# Patient Record
Sex: Female | Born: 1985 | Race: White | Hispanic: No | Marital: Married | Smoking: Former smoker
Health system: Southern US, Community
[De-identification: ages and names within clinical notes are randomized; demographics above are authoritative.]

## PROBLEM LIST (undated history)

## (undated) ENCOUNTER — Inpatient Hospital Stay (HOSPITAL_COMMUNITY): Payer: Self-pay

## (undated) DIAGNOSIS — F419 Anxiety disorder, unspecified: Secondary | ICD-10-CM

## (undated) DIAGNOSIS — G43909 Migraine, unspecified, not intractable, without status migrainosus: Secondary | ICD-10-CM

## (undated) DIAGNOSIS — F329 Major depressive disorder, single episode, unspecified: Secondary | ICD-10-CM

## (undated) DIAGNOSIS — R87629 Unspecified abnormal cytological findings in specimens from vagina: Secondary | ICD-10-CM

## (undated) DIAGNOSIS — F32A Depression, unspecified: Secondary | ICD-10-CM

## (undated) HISTORY — DX: Anxiety disorder, unspecified: F41.9

## (undated) HISTORY — DX: Unspecified abnormal cytological findings in specimens from vagina: R87.629

## (undated) HISTORY — DX: Major depressive disorder, single episode, unspecified: F32.9

## (undated) HISTORY — PX: ESOPHAGOGASTRODUODENOSCOPY ENDOSCOPY: SHX5814

## (undated) HISTORY — DX: Depression, unspecified: F32.A

## (undated) HISTORY — DX: Migraine, unspecified, not intractable, without status migrainosus: G43.909

---

## 2010-12-08 HISTORY — PX: CRYOTHERAPY: SHX1416

## 2017-04-22 DIAGNOSIS — M5382 Other specified dorsopathies, cervical region: Secondary | ICD-10-CM | POA: Insufficient documentation

## 2017-04-22 DIAGNOSIS — R51 Headache: Secondary | ICD-10-CM

## 2017-04-22 DIAGNOSIS — M1990 Unspecified osteoarthritis, unspecified site: Secondary | ICD-10-CM | POA: Insufficient documentation

## 2017-04-22 DIAGNOSIS — M530 Cervicocranial syndrome: Secondary | ICD-10-CM | POA: Insufficient documentation

## 2017-04-22 DIAGNOSIS — G8929 Other chronic pain: Secondary | ICD-10-CM | POA: Insufficient documentation

## 2017-08-25 LAB — OB RESULTS CONSOLE HGB/HCT, BLOOD
HCT: 41
HEMOGLOBIN: 13.7

## 2017-08-25 LAB — OB RESULTS CONSOLE ABO/RH: RH TYPE: NEGATIVE

## 2017-08-25 LAB — OB RESULTS CONSOLE PLATELET COUNT: Platelets: 333

## 2017-08-25 LAB — OB RESULTS CONSOLE RUBELLA ANTIBODY, IGM: Rubella: IMMUNE

## 2017-08-25 LAB — OB RESULTS CONSOLE RPR: RPR: NONREACTIVE

## 2017-08-25 LAB — OB RESULTS CONSOLE ANTIBODY SCREEN: ANTIBODY SCREEN: NEGATIVE

## 2017-08-25 LAB — OB RESULTS CONSOLE HEPATITIS B SURFACE ANTIGEN: HEP B S AG: NEGATIVE

## 2017-08-25 LAB — HIV ANTIBODY (ROUTINE TESTING W REFLEX): HIV Screen 4th Generation wRfx: NEGATIVE

## 2017-09-08 ENCOUNTER — Emergency Department (HOSPITAL_BASED_OUTPATIENT_CLINIC_OR_DEPARTMENT_OTHER)
Admission: EM | Admit: 2017-09-08 | Discharge: 2017-09-08 | Disposition: A | Discharge status: Home / Self Care | Payer: BLUE CROSS/BLUE SHIELD | Attending: Emergency Medicine | Admitting: Emergency Medicine

## 2017-09-08 ENCOUNTER — Encounter (HOSPITAL_BASED_OUTPATIENT_CLINIC_OR_DEPARTMENT_OTHER): Payer: Self-pay

## 2017-09-08 DIAGNOSIS — Z87891 Personal history of nicotine dependence: Secondary | ICD-10-CM | POA: Insufficient documentation

## 2017-09-08 DIAGNOSIS — Z3A09 9 weeks gestation of pregnancy: Secondary | ICD-10-CM | POA: Insufficient documentation

## 2017-09-08 DIAGNOSIS — O200 Threatened abortion: Secondary | ICD-10-CM | POA: Insufficient documentation

## 2017-09-08 DIAGNOSIS — O209 Hemorrhage in early pregnancy, unspecified: Secondary | ICD-10-CM | POA: Diagnosis present

## 2017-09-08 DIAGNOSIS — Z2913 Encounter for prophylactic Rho(D) immune globulin: Secondary | ICD-10-CM | POA: Insufficient documentation

## 2017-09-08 LAB — RH IG WORKUP (INCLUDES ABO/RH)
ABO/RH(D): O NEG
Antibody Screen: NEGATIVE
Gestational Age(Wks): 9

## 2017-09-08 LAB — HCG, QUANTITATIVE, PREGNANCY: HCG, BETA CHAIN, QUANT, S: 110318 m[IU]/mL — AB (ref <5)

## 2017-09-08 MED ORDER — RHO D IMMUNE GLOBULIN 1500 UNIT/2ML IJ SOSY
300.0000 ug | PREFILLED_SYRINGE | Freq: Once | INTRAMUSCULAR | Status: AC
  Administered 2017-09-08: 300 ug via INTRAMUSCULAR
  Filled 2017-09-08: qty 2

## 2017-09-08 NOTE — Discharge Instructions (Signed)
Recommend a repeat quantitative hCG in 48 hours. [facility] any new or worse symptoms.

## 2017-09-08 NOTE — ED Notes (Signed)
Fetal heart tones not found with doppler

## 2017-09-08 NOTE — ED Provider Notes (Signed)
[facility] Provider Note   CSN: [id number] Arrival date & time: 09/08/17  8101     History   Chief Complaint Chief Complaint  Patient presents with  . Vaginal Bleeding    pregnant    HPI Carol [name] is a 31 y.o. female.  Patient with some vaginal spotting that started today. Patient is approximately [redacted] weeks pregnant. Patient new to the area just moving here. But has had more than 1 ultrasound in the past. She is known to have an intrauterine pregnancy that was viable. Her estimated date of delivery is 04/15/2018. She's gravida 3 para 1 AB 1. She has a child at home. Her last pregnancy ended in miscarriage in January. Patient is known to be negative blood type. Patient does not have an OB/GYN in this area. Patient's concerned about possible miscarriage.      History reviewed. No pertinent past medical history.  There are no active problems to display for this patient.   History reviewed. No pertinent surgical history.  OB History    Gravida Para Term Preterm AB Living   1             SAB TAB Ectopic Multiple Live Births                   Home Medications    Prior to Admission medications   Not on File    Family History No family history on file.  Social History Social History  Substance Use Topics  . Smoking status: Former Research scientist (life sciences)  . Smokeless tobacco: Never Used  . Alcohol use No     Allergies   Codeine   Review of Systems Review of Systems  Constitutional: Negative for fever.  HENT: Negative for congestion.   Eyes: Negative for redness.  Respiratory: Negative for shortness of breath.   Cardiovascular: Negative for chest pain.  Gastrointestinal: Negative for abdominal pain.  Genitourinary: Positive for vaginal bleeding. Negative for dysuria and pelvic pain.  Musculoskeletal: Negative for back pain.  Skin: Negative for rash.  Neurological: Negative for headaches.  Hematological: Does not bruise/bleed easily.    Psychiatric/Behavioral: Negative for confusion.     Physical Exam Updated Vital Signs BP 98/64   Pulse 69   Temp 98.2 F (36.8 C) (Oral)   Resp 16   Ht 1.702 m (5\' 7" )   Wt 72.6 kg (160 lb)   SpO2 100%   BMI 25.06 kg/m   Physical Exam  Constitutional: She is oriented to person, place, and time. She appears well-developed and well-nourished. No distress.  HENT:  Head: Normocephalic and atraumatic.  Mouth/Throat: Oropharynx is clear and moist.  Eyes: Pupils are equal, round, and reactive to light. Conjunctivae and EOM are normal.  Neck: Normal range of motion. Neck supple.  Cardiovascular: Normal rate, regular rhythm and normal heart sounds.   Pulmonary/Chest: Effort normal and breath sounds normal.  Abdominal: Soft. Bowel sounds are normal. There is no tenderness.  Musculoskeletal: Normal range of motion.  Neurological: She is alert and oriented to person, place, and time. No cranial nerve deficit or sensory deficit. She exhibits normal muscle tone. Coordination normal.  Skin: Skin is warm.  Nursing note and vitals reviewed.    ED Treatments / Results  Labs (all labs ordered are listed, but only abnormal results are displayed) Labs Reviewed  HCG, QUANTITATIVE, PREGNANCY - Abnormal; Notable for the following:       Result Value   hCG, Beta Chain, Quant, S 110,318 (*)  All other components within normal limits  RH IG WORKUP (INCLUDES ABO/RH)    EKG  EKG Interpretation None       Radiology No results found.  Procedures Procedures (including critical care time)  Medications Ordered in ED Medications  rho (d) immune globulin (RHIG/RHOPHYLAC) injection 300 mcg (300 mcg Intramuscular Given 09/08/17 2211)     Initial Impression / Assessment and Plan / ED Course  I have reviewed the triage vital signs and the nursing notes.  Pertinent labs & imaging results that were available during my care of the patient were reviewed by me and considered in my medical  decision making (see chart for details).    Attempted fetal heart tones. But not able to find them in the sun unexpected at [redacted] weeks pregnant. Patient does not warrant another ultrasound. She is not a risk for ectopic pregnancy. Just started with spotting today. Patient states that she is negative blood type and has received program before. Abdomen soft nontender. No heavy bleeding.  Obtained a quantitative hCG for follow-up purposes. Recommend repeat in 48 hours.  Patient's blood type was O-. Received program here. [facility]. Patient will return for any new or worse symptoms.  Patient nontoxic no acute distress.  Patient understands that the spotting could result in a threatened miscarriage and that there is no technology to stop that.     Final Clinical Impressions(s) / ED Diagnoses   Final diagnoses:  Threatened miscarriage    New Prescriptions New Prescriptions   No medications on file     Fredia Sorrow, MD 09/09/17 0002

## 2017-09-08 NOTE — ED Triage Notes (Signed)
C/o blood on tissue today x 1-pt states she is 9 week preg by US-new to area-no OB-miscarriage in Jan-NAD-steady gait

## 2017-09-28 ENCOUNTER — Ambulatory Visit (INDEPENDENT_AMBULATORY_CARE_PROVIDER_SITE_OTHER): Payer: BLUE CROSS/BLUE SHIELD | Admitting: Obstetrics & Gynecology

## 2017-09-28 ENCOUNTER — Encounter: Payer: Self-pay | Admitting: Obstetrics & Gynecology

## 2017-09-28 VITALS — BP 106/85 | HR 85 | Wt 165.1 lb

## 2017-09-28 DIAGNOSIS — Z113 Encounter for screening for infections with a predominantly sexual mode of transmission: Secondary | ICD-10-CM

## 2017-09-28 DIAGNOSIS — Z6791 Unspecified blood type, Rh negative: Secondary | ICD-10-CM | POA: Insufficient documentation

## 2017-09-28 DIAGNOSIS — Z3480 Encounter for supervision of other normal pregnancy, unspecified trimester: Secondary | ICD-10-CM | POA: Insufficient documentation

## 2017-09-28 DIAGNOSIS — Z1151 Encounter for screening for human papillomavirus (HPV): Secondary | ICD-10-CM

## 2017-09-28 DIAGNOSIS — Z3481 Encounter for supervision of other normal pregnancy, first trimester: Secondary | ICD-10-CM

## 2017-09-28 DIAGNOSIS — G43909 Migraine, unspecified, not intractable, without status migrainosus: Secondary | ICD-10-CM | POA: Insufficient documentation

## 2017-09-28 DIAGNOSIS — Z3490 Encounter for supervision of normal pregnancy, unspecified, unspecified trimester: Secondary | ICD-10-CM | POA: Insufficient documentation

## 2017-09-28 DIAGNOSIS — Z124 Encounter for screening for malignant neoplasm of cervix: Secondary | ICD-10-CM

## 2017-09-28 DIAGNOSIS — R319 Hematuria, unspecified: Secondary | ICD-10-CM

## 2017-09-28 DIAGNOSIS — O09891 Supervision of other high risk pregnancies, first trimester: Secondary | ICD-10-CM

## 2017-09-28 DIAGNOSIS — G43701 Chronic migraine without aura, not intractable, with status migrainosus: Secondary | ICD-10-CM

## 2017-09-28 DIAGNOSIS — O26899 Other specified pregnancy related conditions, unspecified trimester: Principal | ICD-10-CM

## 2017-09-28 LAB — POCT URINALYSIS DIP (DEVICE)
BILIRUBIN URINE: NEGATIVE
Glucose, UA: NEGATIVE mg/dL
LEUKOCYTES UA: NEGATIVE
Nitrite: NEGATIVE
Protein, ur: NEGATIVE mg/dL
SPECIFIC GRAVITY, URINE: 1.02 (ref 1.005–1.030)
Urobilinogen, UA: 0.2 mg/dL (ref 0.0–1.0)
pH: 6 (ref 5.0–8.0)

## 2017-09-28 NOTE — Progress Notes (Signed)
Here for initial prenatal visit. Given new patient education booklets. Scant blood noted in urine.

## 2017-09-29 LAB — CYTOLOGY - PAP
Adequacy: ABSENT
Chlamydia: NEGATIVE
Diagnosis: NEGATIVE
HPV (WINDOPATH): NOT DETECTED
Neisseria Gonorrhea: NEGATIVE

## 2017-09-29 NOTE — Patient Instructions (Signed)
First Trimester of Pregnancy The first trimester of pregnancy is from week 1 until the end of week 13 (months 1 through 3). A week after a sperm fertilizes an egg, the egg will implant on the wall of the uterus. This embryo will begin to develop into a baby. Genes from you and your partner will form the baby. The female genes will determine whether the baby will be a boy or a girl. At 6-8 weeks, the eyes and face will be formed, and the heartbeat can be seen on ultrasound. At the end of 12 weeks, all the baby's organs will be formed. Now that you are pregnant, you will want to do everything you can to have a healthy baby. Two of the most important things are to get good prenatal care and to follow your health care provider's instructions. Prenatal care is all the medical care you receive before the baby's birth. This care will help prevent, find, and treat any problems during the pregnancy and childbirth. Body changes during your first trimester Your body goes through many changes during pregnancy. The changes vary from woman to woman.  You may gain or lose a couple of pounds at first.  You may feel sick to your stomach (nauseous) and you may throw up (vomit). If the vomiting is uncontrollable, call your health care provider.  You may tire easily.  You may develop headaches that can be relieved by medicines. All medicines should be approved by your health care provider.  You may urinate more often. Painful urination may mean you have a bladder infection.  You may develop heartburn as a result of your pregnancy.  You may develop constipation because certain hormones are causing the muscles that push stool through your intestines to slow down.  You may develop hemorrhoids or swollen veins (varicose veins).  Your breasts may begin to grow larger and become tender. Your nipples may stick out more, and the tissue that surrounds them (areola) may become darker.  Your gums may bleed and may be  sensitive to brushing and flossing.  Dark spots or blotches (chloasma, mask of pregnancy) may develop on your face. This will likely fade after the baby is born.  Your menstrual periods will stop.  You may have a loss of appetite.  You may develop cravings for certain kinds of food.  You may have changes in your emotions from day to day, such as being excited to be pregnant or being concerned that something may go wrong with the pregnancy and baby.  You may have more vivid and strange dreams.  You may have changes in your hair. These can include thickening of your hair, rapid growth, and changes in texture. Some women also have hair loss during or after pregnancy, or hair that feels dry or thin. Your hair will most likely return to normal after your baby is born.  What to expect at prenatal visits During a routine prenatal visit:  You will be weighed to make sure you and the baby are growing normally.  Your blood pressure will be taken.  Your abdomen will be measured to track your baby's growth.  The fetal heartbeat will be listened to between weeks 10 and 14 of your pregnancy.  Test results from any previous visits will be discussed.  Your health care provider may ask you:  How you are feeling.  If you are feeling the baby move.  If you have had any abnormal symptoms, such as leaking fluid, bleeding, severe headaches,   or abdominal cramping.  If you are using any tobacco products, including cigarettes, chewing tobacco, and electronic cigarettes.  If you have any questions.  Other tests that may be performed during your first trimester include:  Blood tests to find your blood type and to check for the presence of any previous infections. The tests will also be used to check for low iron levels (anemia) and protein on red blood cells (Rh antibodies). Depending on your risk factors, or if you previously had diabetes during pregnancy, you may have tests to check for high blood  sugar that affects pregnant women (gestational diabetes).  Urine tests to check for infections, diabetes, or protein in the urine.  An ultrasound to confirm the proper growth and development of the baby.  Fetal screens for spinal cord problems (spina bifida) and Down syndrome.  HIV (human immunodeficiency virus) testing. Routine prenatal testing includes screening for HIV, unless you choose not to have this test.  You may need other tests to make sure you and the baby are doing well.  Follow these instructions at home: Medicines  Follow your health care provider's instructions regarding medicine use. Specific medicines may be either safe or unsafe to take during pregnancy.  Take a prenatal vitamin that contains at least 600 micrograms (mcg) of folic acid.  If you develop constipation, try taking a stool softener if your health care provider approves. Eating and drinking  Eat a balanced diet that includes fresh fruits and vegetables, whole grains, good sources of protein such as meat, eggs, or tofu, and low-fat dairy. Your health care provider will help you determine the amount of weight gain that is right for you.  Avoid raw meat and uncooked cheese. These carry germs that can cause birth defects in the baby.  Eating four or five small meals rather than three large meals a day may help relieve nausea and vomiting. If you start to feel nauseous, eating a few soda crackers can be helpful. Drinking liquids between meals, instead of during meals, also seems to help ease nausea and vomiting.  Limit foods that are high in fat and processed sugars, such as fried and sweet foods.  To prevent constipation: ? Eat foods that are high in fiber, such as fresh fruits and vegetables, whole grains, and beans. ? Drink enough fluid to keep your urine clear or pale yellow. Activity  Exercise only as directed by your health care provider. Most women can continue their usual exercise routine during  pregnancy. Try to exercise for 30 minutes at least 5 days a week. Exercising will help you: ? Control your weight. ? Stay in shape. ? Be prepared for labor and delivery.  Experiencing pain or cramping in the lower abdomen or lower back is a good sign that you should stop exercising. Check with your health care provider before continuing with normal exercises.  Try to avoid standing for long periods of time. Move your legs often if you must stand in one place for a long time.  Avoid heavy lifting.  Wear low-heeled shoes and practice good posture.  You may continue to have sex unless your health care provider tells you not to. Relieving pain and discomfort  Wear a good support bra to relieve breast tenderness.  Take warm sitz baths to soothe any pain or discomfort caused by hemorrhoids. Use hemorrhoid cream if your health care provider approves.  Rest with your legs elevated if you have leg cramps or low back pain.  If you develop   varicose veins in your legs, wear support hose. Elevate your feet for 15 minutes, 3-4 times a day. Limit salt in your diet. Prenatal care  Schedule your prenatal visits by the twelfth week of pregnancy. They are usually scheduled monthly at first, then more often in the last 2 months before delivery.  Write down your questions. Take them to your prenatal visits.  Keep all your prenatal visits as told by your health care provider. This is important. Safety  Wear your seat belt at all times when driving.  Make a list of emergency phone numbers, including numbers for family, friends, the hospital, and police and fire departments. General instructions  Ask your health care provider for a referral to a local prenatal education class. Begin classes no later than the beginning of month 6 of your pregnancy.  Ask for help if you have counseling or nutritional needs during pregnancy. Your health care provider can offer advice or refer you to specialists for help  with various needs.  Do not use hot tubs, steam rooms, or saunas.  Do not douche or use tampons or scented sanitary pads.  Do not cross your legs for long periods of time.  Avoid cat litter boxes and soil used by cats. These carry germs that can cause birth defects in the baby and possibly loss of the fetus by miscarriage or stillbirth.  Avoid all smoking, herbs, alcohol, and medicines not prescribed by your health care provider. Chemicals in these products affect the formation and growth of the baby.  Do not use any products that contain nicotine or tobacco, such as cigarettes and e-cigarettes. If you need help quitting, ask your health care provider. You may receive counseling support and other resources to help you quit.  Schedule a dentist appointment. At home, brush your teeth with a soft toothbrush and be gentle when you floss. Contact a health care provider if:  You have dizziness.  You have mild pelvic cramps, pelvic pressure, or nagging pain in the abdominal area.  You have persistent nausea, vomiting, or diarrhea.  You have a bad smelling vaginal discharge.  You have pain when you urinate.  You notice increased swelling in your face, hands, legs, or ankles.  You are exposed to fifth disease or chickenpox.  You are exposed to German measles (rubella) and have never had it. Get help right away if:  You have a fever.  You are leaking fluid from your vagina.  You have spotting or bleeding from your vagina.  You have severe abdominal cramping or pain.  You have rapid weight gain or loss.  You vomit blood or material that looks like coffee grounds.  You develop a severe headache.  You have shortness of breath.  You have any kind of trauma, such as from a fall or a car accident. Summary  The first trimester of pregnancy is from week 1 until the end of week 13 (months 1 through 3).  Your body goes through many changes during pregnancy. The changes vary from  woman to woman.  You will have routine prenatal visits. During those visits, your health care provider will examine you, discuss any test results you may have, and talk with you about how you are feeling. This information is not intended to replace advice given to you by your health care provider. Make sure you discuss any questions you have with your health care provider. Document Released: 11/18/2001 Document Revised: 11/05/2016 Document Reviewed: 11/05/2016 Elsevier Interactive Patient Education  2017 Elsevier   Inc.  

## 2017-09-29 NOTE — Progress Notes (Signed)
Subjective:   Carol [name] is a 31 y.o. G3P1011 at [redacted]w[redacted]d by LMP, early ultrasound being seen today for her first obstetrical visit.  Her obstetrical history is significant for recent SAB and term SVD.  Received initial visit and labs at her previous OB's office but just moved here. Results are under Media tab. Patient does intend to breast feed. Pregnancy history fully reviewed.  Patient reports no complaints.  HISTORY: Obstetric History   G3   P1   T1   P0   A1   L1    SAB1   TAB0   Ectopic0   Multiple0   Live Births1     # Outcome Date GA Lbr Len/2nd Weight Sex Delivery Anes PTL Lv  3 Current           2 SAB 12/2016 [redacted]w[redacted]d         1 Term 05/27/14 [redacted]w[redacted]d  8 lb 10 oz (3.912 kg) F Vag-Forceps EPI  LIV     Past Medical History:  Diagnosis Date  . Anxiety   . Depression   . Migraines   . Vaginal Pap smear, abnormal    Past Surgical History:  Procedure Laterality Date  . CRYOTHERAPY  2012   Family History  Problem Relation Age of Onset  . Lung disease Mother   . COPD Mother   . Gout Father    Social History  Substance Use Topics  . Smoking status: Former Research scientist (life sciences)  . Smokeless tobacco: Never Used  . Alcohol use No   Allergies  Allergen Reactions  . Codeine Nausea And Vomiting   No current outpatient prescriptions on file prior to visit.   No current facility-administered medications on file prior to visit.      Exam   Vitals:   09/28/17 0823  BP: 106/85  Pulse: 85  Weight: 165 lb 1.6 oz (74.9 kg)   Fetal Heart Rate (bpm): 160  Uterus:     Pelvic Exam: Perineum: no hemorrhoids, normal perineum   Vulva: normal external genitalia, no lesions   Vagina:  normal mucosa, normal discharge   Cervix: no lesions and normal, pap smear done.    Adnexa: normal adnexa and no mass, fullness, tenderness   Bony Pelvis: average  System: General: well-developed, well-nourished female in no acute distress   Breast:  normal appearance, no masses or tenderness   Skin: normal coloration and turgor, no rashes   Neurologic: oriented, normal, negative, normal mood   Extremities: normal strength, tone, and muscle mass, ROM of all joints is normal   HEENT PERRLA, extraocular movement intact and sclera clear, anicteric   Mouth/Teeth mucous membranes moist, pharynx normal without lesions and dental hygiene good   Neck supple and no masses   Cardiovascular: regular rate and rhythm   Respiratory:  no respiratory distress, normal breath sounds   Abdomen: soft, non-tender; bowel sounds normal; no masses,  no organomegaly     Assessment:   Pregnancy: [id number] Patient Active Problem List   Diagnosis Date Noted  . Rh negative state in antepartum period 09/28/2017  . Supervision of normal pregnancy 09/28/2017  . Migraines   . Chronic headache disorder 04/22/2017  . 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 Musculoskeletal disorder of neck 04/22/2017  . Osteoarthritis 04/22/2017     Plan:  1. Chronic migraine without aura with status migrainosus, not intractable Stable for now  2. Hematuria, unspecified type - Culture, OB Urine  3. Rh negative state in antepartum period Will need Rhogam  at 28 weeks  4. Encounter for supervision of other normal pregnancy in first trimester - Babyscripts Schedule Optimization - Cytology - PAP - [place] MFM Fetal Nuchal Translucency; Future  Initial labs drawn. Continue prenatal vitamins. Genetic Screening discussed, First trimester screen, Quad screen and NIPS: Frist screen ordered, considering NIPS. Ultrasound discussed; fetal anatomic survey: to be ordered later Problem list reviewed and updated. The nature of [facility] with multiple MDs and other Advanced Practice Providers was explained to patient; also emphasized that residents, students are part of our team. Routine obstetric precautions reviewed. Return in about 2 weeks (around 10/12/2017) for OB 13 week visit  (Babyscripts).   Verita Schneiders, MD, [place] Attending [place], [facility] for [employer], [place]

## 2017-09-30 ENCOUNTER — Encounter: Payer: Self-pay | Admitting: Obstetrics & Gynecology

## 2017-09-30 LAB — URINE CULTURE, OB REFLEX

## 2017-09-30 LAB — CULTURE, OB URINE

## 2017-10-05 ENCOUNTER — Encounter (HOSPITAL_COMMUNITY): Payer: Self-pay | Admitting: Obstetrics & Gynecology

## 2017-10-08 ENCOUNTER — Encounter: Payer: BLUE CROSS/BLUE SHIELD | Admitting: Obstetrics & Gynecology

## 2017-10-08 ENCOUNTER — Ambulatory Visit (INDEPENDENT_AMBULATORY_CARE_PROVIDER_SITE_OTHER): Payer: Self-pay | Admitting: Obstetrics & Gynecology

## 2017-10-08 ENCOUNTER — Encounter: Payer: Self-pay | Admitting: *Deleted

## 2017-10-08 VITALS — BP 105/70 | HR 108 | Wt 171.0 lb

## 2017-10-08 DIAGNOSIS — Z3687 Encounter for antenatal screening for uncertain dates: Secondary | ICD-10-CM

## 2017-10-08 DIAGNOSIS — Z3481 Encounter for supervision of other normal pregnancy, first trimester: Secondary | ICD-10-CM

## 2017-10-08 DIAGNOSIS — Z3689 Encounter for other specified antenatal screening: Secondary | ICD-10-CM

## 2017-10-08 NOTE — Progress Notes (Signed)
DATING AND VIABILITY SONOGRAM   Carol [name] is a 31 y.o. year old G44P1011 with LMP Patient's last menstrual period was 07/12/2017. which would correlate to  [redacted]w[redacted]d weeks gestation.  She has regular menstrual cycles.   She is here today for a confirmatory initial sonogram.    GESTATION: SINGLETON     FETAL ACTIVITY:          Heart rate: 156bpm          The fetus is active.  GESTATIONAL AGE AND  BIOMETRICS:  Gestational criteria: Estimated Date of Delivery: 04/18/18 by LMP now at [redacted]w[redacted]d   CROWN RUMP LENGTH   7.47cm 13.3wks  HC 8.58cm 13.5wks                                                   AVERAGE EGA(BY THIS SCAN):  13.4 weeks  WORKING EDD( LMP ):  04/18/18     TECHNICIAN COMMENTS:  SLIUP measuring 13.4wks AUA by CRL and HC. FHR 156bpm. EDD stays at LMP per Dr. Harolyn Rutherford.    A copy of this report including all images has been saved and backed up to a second source for retrieval if needed. All measures and details of the anatomical scan, placentation, fluid volume and pelvic anatomy are contained in that report.  Mandy Hutchinson 10/08/2017 8:44 AM

## 2017-10-08 NOTE — Progress Notes (Signed)
   PRENATAL VISIT NOTE  Subjective:  Carol [name] is a 31 y.o. G3P1011 at [redacted]w[redacted]d being seen today for ongoing prenatal care.  She is currently monitored for the following issues for this low-risk pregnancy and has Migraines; Chronic headache disorder; Cervicocranial syndrome; Musculoskeletal disorder of neck; Osteoarthritis; Rh negative state in antepartum period; and Supervision of normal pregnancy on her problem list.  Patient reports no complaints. Denies leaking of fluid.   The following portions of the patient's history were reviewed and updated as appropriate: allergies, current medications, past family history, past medical history, past social history, past surgical history and problem list. Problem list updated.  Objective:   Vitals:   10/08/17 0856  BP: 105/70  Pulse: (!) 108  Weight: 171 lb (77.6 kg)       Fetal Status: Fetal Heart Rate (bpm): 156         General:  Alert, oriented and cooperative. Patient is in no acute distress.  Skin: Skin is warm and dry. No rash noted.   Cardiovascular: Normal heart rate noted  Respiratory: Normal respiratory effort, no problems with respiration noted  Abdomen: Soft, gravid, appropriate for gestational age.        Pelvic: Cervical exam deferred        Extremities: Normal range of motion.     Mental Status:  Normal mood and affect. Normal behavior. Normal judgment and thought content.   Assessment and Plan:  Pregnancy: G3P1011 at [redacted]w[redacted]d  1. Encounter for fetal anatomic survey Anatomy scan ordered - US MFM OB COMP + 14 WK; Future  2. Encounter for supervision of other normal pregnancy in first trimester Patient desires NIPS, Panorama ordered.  Anatomy scan also ordered; patient cancelled previously scheduled NT scan. - Genetic Screening - US MFM OB COMP + 14 WK; Future No other complaints or concerns.  Routine obstetric precautions reviewed. Please refer to After Visit Summary for other counseling recommendations.    Return in about 8 weeks (around 12/03/2017) for OB 20 week visit (Babyscripts).   Verita Schneiders, MD

## 2017-10-08 NOTE — Patient Instructions (Signed)

## 2017-10-12 NOTE — Progress Notes (Signed)
This encounter was created in error - please disregard.

## 2017-10-13 ENCOUNTER — Other Ambulatory Visit (HOSPITAL_COMMUNITY): Payer: BLUE CROSS/BLUE SHIELD

## 2017-10-20 ENCOUNTER — Encounter: Payer: Self-pay | Admitting: *Deleted

## 2017-10-20 ENCOUNTER — Encounter: Payer: Self-pay | Admitting: Obstetrics & Gynecology

## 2017-11-23 ENCOUNTER — Other Ambulatory Visit: Payer: Self-pay | Admitting: Obstetrics & Gynecology

## 2017-11-23 ENCOUNTER — Ambulatory Visit (HOSPITAL_COMMUNITY)
Admission: RE | Admit: 2017-11-23 | Discharge: 2017-11-23 | Disposition: A | Discharge status: Home / Self Care | Payer: Managed Care, Other (non HMO) | Source: Ambulatory Visit | Attending: Obstetrics & Gynecology | Admitting: Obstetrics & Gynecology

## 2017-11-23 DIAGNOSIS — Z3A19 19 weeks gestation of pregnancy: Secondary | ICD-10-CM | POA: Diagnosis not present

## 2017-11-23 DIAGNOSIS — Z3689 Encounter for other specified antenatal screening: Secondary | ICD-10-CM | POA: Diagnosis not present

## 2017-11-23 DIAGNOSIS — O321XX0 Maternal care for breech presentation, not applicable or unspecified: Secondary | ICD-10-CM | POA: Insufficient documentation

## 2017-11-23 DIAGNOSIS — Z3481 Encounter for supervision of other normal pregnancy, first trimester: Secondary | ICD-10-CM

## 2017-11-23 DIAGNOSIS — O358XX0 Maternal care for other (suspected) fetal abnormality and damage, not applicable or unspecified: Secondary | ICD-10-CM | POA: Diagnosis not present

## 2017-12-03 ENCOUNTER — Ambulatory Visit (INDEPENDENT_AMBULATORY_CARE_PROVIDER_SITE_OTHER): Payer: Managed Care, Other (non HMO) | Admitting: Obstetrics & Gynecology

## 2017-12-03 ENCOUNTER — Encounter: Payer: Self-pay | Admitting: Obstetrics & Gynecology

## 2017-12-03 VITALS — BP 118/72 | HR 100 | Wt 180.0 lb

## 2017-12-03 DIAGNOSIS — Z3402 Encounter for supervision of normal first pregnancy, second trimester: Secondary | ICD-10-CM

## 2017-12-03 DIAGNOSIS — Z0489 Encounter for examination and observation for other specified reasons: Secondary | ICD-10-CM

## 2017-12-03 DIAGNOSIS — IMO0002 Reserved for concepts with insufficient information to code with codable children: Secondary | ICD-10-CM

## 2017-12-03 NOTE — Progress Notes (Signed)
PRENATAL VISIT NOTE  Subjective:  Carol [name] is a 31 y.o. G3P1011 at [redacted]w[redacted]d being seen today for ongoing prenatal care.  She is currently monitored for the following issues for this low-risk pregnancy and has Migraines; Chronic headache disorder; Cervicocranial syndrome; Musculoskeletal disorder of neck; Osteoarthritis; Rh negative state in antepartum period; and Supervision of normal pregnancy on their problem list.  Patient reports no complaints.   .  .  Movement: Present. Denies leaking of fluid.   The following portions of the patient's history were reviewed and updated as appropriate: allergies, current medications, past family history, past medical history, past social history, past surgical history and problem list. Problem list updated.  Objective:   Vitals:   12/03/17 1109  BP: 118/72  Pulse: 100  Weight: 180 lb (81.6 kg)    Fetal Status: Fetal Heart Rate (bpm): 141 Fundal Height: 20 cm Movement: Present     General:  Alert, oriented and cooperative. Patient is in no acute distress.  Skin: Skin is warm and dry. No rash noted.   Cardiovascular: Normal heart rate noted  Respiratory: Normal respiratory effort, no problems with respiration noted  Abdomen: Soft, gravid, appropriate for gestational age.  Pain/Pressure: Absent     Pelvic: Cervical exam deferred        Extremities: Normal range of motion.     Mental Status:  Normal mood and affect. Normal behavior. Normal judgment and thought content.   [place] Mfm Ob Comp + 14 Wk  Result Date: 11/23/2017 ----------------------------------------------------------------------  OBSTETRICS REPORT                      (Signed Final 11/23/2017 08:59 am) ---------------------------------------------------------------------- Patient Info  ID #:       [id number]                          D.O.B.:  Jul 02, 1986 (31 yrs)  Name:       Carol [name]             Visit Date: 11/23/2017 08:11 am  ---------------------------------------------------------------------- Performed By  Performed By:     Valda Favia          Ref. Address:     [place]  Attending:        Griffin Dakin MD         Location:         [facility]  Referred By:      [facility] for                    [facility]                    Healthcare ---------------------------------------------------------------------- Orders   #  Description  Code   1  [place] MFM OB COMP + 25 WK                      [id number]  ----------------------------------------------------------------------   #  Ordered By               Order #        Accession #    Episode #   1  Verita Schneiders           [id number]      [id number]     [id number]  ---------------------------------------------------------------------- Indications   [redacted] weeks gestation of pregnancy                Z3A.19  ---------------------------------------------------------------------- OB History  Blood Type:            Height:  5'7"   Weight (lb):  171       BMI:  26.78  Gravidity:    3         Term:   1        Prem:   0        SAB:   1  TOP:          0       Ectopic:  0        Living: 1 ---------------------------------------------------------------------- Fetal Evaluation  Num Of Fetuses:     1  Fetal Heart         138  Rate(bpm):  Cardiac Activity:   Observed  Presentation:       Breech  Placenta:           Posterior, above cervical os  P. Cord Insertion:  Visualized, central  Amniotic Fluid  AFI FV:      Subjectively within normal limits                              Largest Pocket(cm)                              5.5 ---------------------------------------------------------------------- Biometry  BPD:      46.7  mm     G. Age:  20w 1d         86  %    CI:        75.68   %    70 - 86                                                          FL/HC:       16.2   %    16.1 - 18.3  HC:      170.2  mm     G. Age:  19w 4d         66  %    HC/AC:      1.15        1.09 - 1.39  AC:      148.3  mm     G. Age:  20w 1d         76  %    FL/BPD:     58.9   %  FL:       27.5  mm     G. Age:  18w 3d         20  %    FL/AC:      18.5   %    20 - 24  HUM:      28.5  mm     G. Age:  19w 1d         52  %  Est. FW:     289  gm    0 lb 10 oz      50  % ---------------------------------------------------------------------- Gestational Age  LMP:           19w 1d        Date:  07/12/17                 EDD:   04/18/18  U/S Today:     19w 4d                                        EDD:   04/15/18  Best:          19w 1d     Det. By:  LMP  (07/12/17)          EDD:   04/18/18 ---------------------------------------------------------------------- Anatomy  Cranium:               Appears normal         Aortic Arch:            Appears normal  Cavum:                 Appears normal         Ductal Arch:            Not well visualized  Ventricles:            Appears normal         Diaphragm:              Appears normal  Choroid Plexus:        Appears normal         Stomach:                Appears normal, left                                                                        sided  Cerebellum:            Appears normal         Abdomen:                Appears normal  Posterior Fossa:       Appears normal         Abdominal Wall:         Appears nml (cord  insert, abd wall)  Nuchal Fold:           Appears normal         Cord Vessels:           Appears normal (3                                                                        vessel cord)  Face:                  Appears normal         Kidneys:                Bilat pyelectasis,                         (orbits and profile)                                                                        Rt 49mm, Lt 56mm  Lips:                  Appears normal         Bladder:                 Appears normal  Thoracic:              Appears normal         Spine:                  Not well visualized  Heart:                 Not well visualized    Upper Extremities:      Appears normal  RVOT:                  Not well visualized    Lower Extremities:      Appears normal  LVOT:                  Appears normal  Other:  Fetus appears to be a female. Nasal bone visualized. Technically          difficult due to fetal position. ---------------------------------------------------------------------- Cervix Uterus Adnexa  Cervix  Length:            4.6  cm.  Normal appearance by transabdominal scan.  Uterus  No abnormality visualized.  Left Ovary  No adnexal mass visualized.  Right Ovary  No adnexal mass visualized.  Cul De Sac:   No free fluid seen.  Adnexa:       No abnormality visualized. ---------------------------------------------------------------------- Impression  Singleton intrauterine pregnancy at 19+1 weeks, here for  anatomic survey  Review of the anatomy shows no sonographic markers for  aneuploidy or structural anomalies. there is borderline  pyelectasis of 49mm in each renal pelvis (UTD A1)  The cardiac views should be considered suboptimal  secondary to fetal position  Amniotic  fluid volume is normal  Estimated fetal weight is 289g which is growth in the 50th  percentile ---------------------------------------------------------------------- Recommendations  Recommend follow-up ultrasound examination in 4 weeks to  "clear" cardiac anantomy and reassess fetal renal status ----------------------------------------------------------------------                 Griffin Dakin, MD Electronically Signed Final Report   11/23/2017 08:59 am ----------------------------------------------------------------------   Assessment and Plan:  Pregnancy: G3P1011 at [redacted]w[redacted]d  1. Evaluate anatomy not seen on prior sonogram Follow up scan ordered - US MFM OB FOLLOW UP; Future  2. Encounter for supervision of normal first  pregnancy in second trimester Preterm labor symptoms and general obstetric precautions including but not limited to vaginal bleeding, contractions, leaking of fluid and fetal movement were reviewed in detail with the patient. Please refer to After Visit Summary for other counseling recommendations.  Return in about 8 weeks (around 01/28/2018) for 2 hr GTT, 3rd trimester labs, TDap, Rhogam, OB Visit.   Verita Schneiders, MD

## 2017-12-03 NOTE — Patient Instructions (Signed)
Return to clinic for any scheduled appointments or obstetric concerns, or go to MAU for evaluation  

## 2017-12-04 ENCOUNTER — Other Ambulatory Visit: Payer: Self-pay | Admitting: Obstetrics & Gynecology

## 2017-12-04 ENCOUNTER — Encounter: Payer: Self-pay | Admitting: Obstetrics & Gynecology

## 2017-12-04 DIAGNOSIS — Z3481 Encounter for supervision of other normal pregnancy, first trimester: Secondary | ICD-10-CM

## 2017-12-04 MED ORDER — PROMETHAZINE HCL 25 MG PO TABS: ORAL_TABLET | ORAL | 5 refills | Status: DC

## 2017-12-08 ENCOUNTER — Encounter: Payer: Self-pay | Admitting: Obstetrics & Gynecology

## 2017-12-08 NOTE — L&D Delivery Note (Signed)
Patient is 32 y.o. [id number] [redacted]w[redacted]d admitted for IOL for term/LGA. S/p IOL with foley bulb, followed by Pitocin. AROM at 1404.  Prenatal course also complicated by LGA.  Delivery Note At 8:40 PM a viable female was delivered via  (Presentation: LOA).  APGAR: 8, 9; weight pending 1hr skin to skin.   Placenta status: Intact.  Cord: 3V with the following complications: None.  Cord pH: N/A  Anesthesia: Epidural   Episiotomy: None Lacerations: 1st degree;Perineal Suture Repair: vicryl 4.0 Est. Blood Loss (mL): 300  Mom to postpartum.  Baby to Couplet care / Skin to Skin.  Luiz Blare, DO 04/14/2018, 9:09 PM OB Fellow [facility], [facility]

## 2017-12-09 ENCOUNTER — Inpatient Hospital Stay (HOSPITAL_COMMUNITY): Payer: 59

## 2017-12-09 ENCOUNTER — Inpatient Hospital Stay (HOSPITAL_COMMUNITY)
Admission: AD | Admit: 2017-12-09 | Discharge: 2017-12-09 | Disposition: A | Discharge status: Home / Self Care | Payer: 59 | Source: Ambulatory Visit | Attending: Obstetrics & Gynecology | Admitting: Obstetrics & Gynecology

## 2017-12-09 ENCOUNTER — Other Ambulatory Visit: Payer: Self-pay

## 2017-12-09 ENCOUNTER — Encounter: Payer: Self-pay | Admitting: Obstetrics & Gynecology

## 2017-12-09 ENCOUNTER — Encounter (HOSPITAL_COMMUNITY): Payer: Self-pay

## 2017-12-09 DIAGNOSIS — Z3A21 21 weeks gestation of pregnancy: Secondary | ICD-10-CM | POA: Diagnosis not present

## 2017-12-09 DIAGNOSIS — O4692 Antepartum hemorrhage, unspecified, second trimester: Secondary | ICD-10-CM | POA: Insufficient documentation

## 2017-12-09 DIAGNOSIS — F419 Anxiety disorder, unspecified: Secondary | ICD-10-CM | POA: Diagnosis not present

## 2017-12-09 DIAGNOSIS — O26892 Other specified pregnancy related conditions, second trimester: Secondary | ICD-10-CM | POA: Insufficient documentation

## 2017-12-09 DIAGNOSIS — O360920 Maternal care for other rhesus isoimmunization, second trimester, not applicable or unspecified: Secondary | ICD-10-CM | POA: Insufficient documentation

## 2017-12-09 DIAGNOSIS — O99352 Diseases of the nervous system complicating pregnancy, second trimester: Secondary | ICD-10-CM | POA: Insufficient documentation

## 2017-12-09 DIAGNOSIS — O09892 Supervision of other high risk pregnancies, second trimester: Secondary | ICD-10-CM | POA: Insufficient documentation

## 2017-12-09 DIAGNOSIS — G43909 Migraine, unspecified, not intractable, without status migrainosus: Secondary | ICD-10-CM | POA: Diagnosis not present

## 2017-12-09 DIAGNOSIS — F329 Major depressive disorder, single episode, unspecified: Secondary | ICD-10-CM | POA: Diagnosis not present

## 2017-12-09 DIAGNOSIS — Z6791 Unspecified blood type, Rh negative: Secondary | ICD-10-CM

## 2017-12-09 DIAGNOSIS — Z3402 Encounter for supervision of normal first pregnancy, second trimester: Secondary | ICD-10-CM

## 2017-12-09 DIAGNOSIS — O4702 False labor before 37 completed weeks of gestation, second trimester: Secondary | ICD-10-CM | POA: Insufficient documentation

## 2017-12-09 DIAGNOSIS — O99342 Other mental disorders complicating pregnancy, second trimester: Secondary | ICD-10-CM | POA: Insufficient documentation

## 2017-12-09 DIAGNOSIS — R109 Unspecified abdominal pain: Secondary | ICD-10-CM | POA: Insufficient documentation

## 2017-12-09 DIAGNOSIS — O26899 Other specified pregnancy related conditions, unspecified trimester: Secondary | ICD-10-CM

## 2017-12-09 LAB — URINALYSIS, ROUTINE W REFLEX MICROSCOPIC
BILIRUBIN URINE: NEGATIVE
Glucose, UA: NEGATIVE mg/dL
KETONES UR: NEGATIVE mg/dL
LEUKOCYTES UA: NEGATIVE
Nitrite: NEGATIVE
PROTEIN: 30 mg/dL — AB
Specific Gravity, Urine: 1.009 (ref 1.005–1.030)
pH: 7 (ref 5.0–8.0)

## 2017-12-09 LAB — WET PREP, GENITAL
Clue Cells Wet Prep HPF POC: NONE SEEN
Sperm: NONE SEEN
Trich, Wet Prep: NONE SEEN
Yeast Wet Prep HPF POC: NONE SEEN

## 2017-12-09 MED ORDER — ACETAMINOPHEN 500 MG PO TABS
1000.0000 mg | ORAL_TABLET | Freq: Once | ORAL | Status: AC
  Administered 2017-12-09: 1000 mg via ORAL
  Filled 2017-12-09: qty 2

## 2017-12-09 NOTE — Discharge Instructions (Signed)

## 2017-12-09 NOTE — MAU Note (Signed)
Started cramping last night, bleeding noted this morning when used restroom, is dark.  Denies recent intercourse, no hx of previa or low lying placenta.

## 2017-12-09 NOTE — MAU Provider Note (Signed)
History     CSN: [id number]  Arrival date and time: 12/09/17 0845   First Provider Initiated Contact with Patient 12/09/17 [phone]    Chief Complaint  Patient presents with  . Vaginal Bleeding  . Abdominal Pain   HPI Carol [name] is a 32 y.o. G3P1011 at [redacted]w[redacted]d who presents with abdominal cramping and vaginal bleeding. She states she started cramping last night and it continued this am. She rates the cramps a 5/10 and has not taken anything for the pain. She states this morning she saw pink when she wiped. She reports feeling flutters last night but is not feeling consistent fetal movement yet. She reports no problems in the pregnancy so far and gets prenatal care at [facility].   OB History    Gravida Para Term Preterm AB Living   3 1 1   1 1    SAB TAB Ectopic Multiple Live Births   1       1      Past Medical History:  Diagnosis Date  . Anxiety   . Depression   . Migraines   . Vaginal Pap smear, abnormal     Past Surgical History:  Procedure Laterality Date  . CRYOTHERAPY  2012    Family History  Problem Relation Age of Onset  . Lung disease Mother   . COPD Mother   . Gout Father     Social History   Tobacco Use  . Smoking status: Former Research scientist (life sciences)  . Smokeless tobacco: Never Used  Substance Use Topics  . Alcohol use: No  . Drug use: No    Allergies:  Allergies  Allergen Reactions  . Codeine Nausea And Vomiting    Medications Prior to Admission  Medication Sig Dispense Refill Last Dose  . acetaminophen (TYLENOL) 500 MG tablet Take 500 mg by mouth every 6 (six) hours as needed.   Not Taking  . Coenzyme Q10 (COQ-10) 10 MG CAPS CoQ-10   Taking  . cyclobenzaprine (FLEXERIL) 5 MG tablet Take 5 mg by mouth 3 (three) times daily as needed for muscle spasms. Take 1/2 to 1 tab   Not Taking  . Prenatal Vit-Fe Fumarate-FA (PRENATAL 1+1 PO) Prenatal   Taking  . promethazine (PHENERGAN) 25 MG tablet promethazine 25 mg tablet  12.5 - 25mg  po q 6 hrs prn HA or  nausea 30 tablet 5   . zolpidem (AMBIEN) 5 MG tablet zolpidem 5 mg tablet   Not Taking    Review of Systems  Constitutional: Negative.  Negative for fatigue and fever.  HENT: Negative.   Respiratory: Negative.  Negative for shortness of breath.   Cardiovascular: Negative.  Negative for chest pain.  Gastrointestinal: Positive for abdominal pain. Negative for constipation, diarrhea, nausea and vomiting.  Genitourinary: Positive for vaginal bleeding. Negative for dysuria.  Neurological: Negative.  Negative for dizziness and headaches.   Physical Exam   Blood pressure 112/71, pulse 90, temperature 98.5 F (36.9 C), temperature source Oral, resp. rate 18, weight 180 lb 8 oz (81.9 kg), last menstrual period 07/12/2017, SpO2 100 %.  Physical Exam  Nursing note and vitals reviewed. Constitutional: She is oriented to person, place, and time. She appears well-developed and well-nourished. No distress.  HENT:  Head: Normocephalic.  Eyes: Pupils are equal, round, and reactive to light.  Cardiovascular: Normal rate, regular rhythm and normal heart sounds.  Respiratory: Effort normal and breath sounds normal. No respiratory distress.  GI: Soft. Bowel sounds are normal. She exhibits no distension. There is  no tenderness.  Genitourinary: Vaginal discharge found.  Neurological: She is alert and oriented to person, place, and time.  Skin: Skin is warm and dry.  Psychiatric: She has a normal mood and affect. Her behavior is normal. Judgment and thought content normal.   Pelvic exam: Cervix pink, visually open, without lesion, white creamy discharge, vaginal walls and external genitalia normal Bimanual exam: neg CMT, uterus nontender, adnexa without tenderness, enlargement, or mass  Dilation: Fingertip Effacement (%): 50 Exam by:: Haynes Bast CNM  FHT: 150 bpm   MAU Course  Procedures Results for orders placed or performed during the hospital encounter of 12/09/17 (from the past 24 hour(s))   Urinalysis, Routine w reflex microscopic     Status: Abnormal   Collection Time: 12/09/17  8:50 AM  Result Value Ref Range   Color, Urine YELLOW YELLOW   APPearance HAZY (A) CLEAR   Specific Gravity, Urine 1.009 1.005 - 1.030   pH 7.0 5.0 - 8.0   Glucose, UA NEGATIVE NEGATIVE mg/dL   Hgb urine dipstick LARGE (A) NEGATIVE   Bilirubin Urine NEGATIVE NEGATIVE   Ketones, ur NEGATIVE NEGATIVE mg/dL   Protein, ur 30 (A) NEGATIVE mg/dL   Nitrite NEGATIVE NEGATIVE   Leukocytes, UA NEGATIVE NEGATIVE   RBC / HPF TOO NUMEROUS TO COUNT 0 - 5 RBC/hpf   WBC, UA 0-5 0 - 5 WBC/hpf   Bacteria, UA RARE (A) NONE SEEN   Squamous Epithelial / LPF 0-5 (A) NONE SEEN   WBC Clumps PRESENT    Mucus PRESENT    MDM UA Wet prep and gc/chlamydia [place] MFM OB Limited US MFM OB Transvaginal- cervical length 3.5cm and no evidence of abruption O neg- received Rhogam 09/08/2017 Assessment and Plan   1. Threatened premature labor in second trimester   2. Rh negative state in antepartum period   3. Encounter for supervision of normal first pregnancy in second trimester   4. Abdominal pain affecting pregnancy   5. [redacted] weeks gestation of pregnancy    -Discharge home in stable condition -Vaginal bleeding and pain precautions discussed -Patient advised to follow-up with [facility] as scheduled for prenatal care -Patient may return to MAU as needed or if her condition were to change or worsen  Wende Mott CNM 12/09/2017, 10:17 AM

## 2017-12-10 LAB — GC/CHLAMYDIA PROBE AMP (~~LOC~~) NOT AT ARMC
CHLAMYDIA, DNA PROBE: NEGATIVE
NEISSERIA GONORRHEA: NEGATIVE

## 2017-12-11 ENCOUNTER — Encounter: Payer: Self-pay | Admitting: Obstetrics & Gynecology

## 2017-12-11 LAB — CULTURE, OB URINE: Culture: <10000 — AB

## 2017-12-14 ENCOUNTER — Telehealth: Payer: Self-pay

## 2017-12-14 NOTE — Telephone Encounter (Signed)
Message has been sent to my chart concerning her ultrasound.

## 2017-12-22 ENCOUNTER — Telehealth: Payer: 59 | Admitting: Family

## 2017-12-22 DIAGNOSIS — B9689 Other specified bacterial agents as the cause of diseases classified elsewhere: Secondary | ICD-10-CM

## 2017-12-22 DIAGNOSIS — J019 Acute sinusitis, unspecified: Secondary | ICD-10-CM

## 2017-12-22 MED ORDER — AMOXICILLIN-POT CLAVULANATE 875-125 MG PO TABS: 1.0000 | ORAL_TABLET | Freq: Two times a day (BID) | ORAL | 0 refills | Status: DC

## 2017-12-22 NOTE — Progress Notes (Signed)

## 2017-12-23 ENCOUNTER — Encounter: Payer: Self-pay | Admitting: Obstetrics & Gynecology

## 2017-12-25 ENCOUNTER — Encounter: Payer: Self-pay | Admitting: Obstetrics & Gynecology

## 2017-12-25 ENCOUNTER — Ambulatory Visit (HOSPITAL_COMMUNITY)
Admission: RE | Admit: 2017-12-25 | Discharge: 2017-12-25 | Disposition: A | Discharge status: Home / Self Care | Payer: 59 | Source: Ambulatory Visit | Attending: Obstetrics & Gynecology | Admitting: Obstetrics & Gynecology

## 2017-12-25 DIAGNOSIS — Z3A23 23 weeks gestation of pregnancy: Secondary | ICD-10-CM | POA: Diagnosis not present

## 2017-12-25 DIAGNOSIS — IMO0002 Reserved for concepts with insufficient information to code with codable children: Secondary | ICD-10-CM

## 2017-12-25 DIAGNOSIS — O35EXX0 Maternal care for other (suspected) fetal abnormality and damage, fetal genitourinary anomalies, not applicable or unspecified: Secondary | ICD-10-CM | POA: Insufficient documentation

## 2017-12-25 DIAGNOSIS — Z0489 Encounter for examination and observation for other specified reasons: Secondary | ICD-10-CM | POA: Diagnosis not present

## 2017-12-25 DIAGNOSIS — O358XX0 Maternal care for other (suspected) fetal abnormality and damage, not applicable or unspecified: Secondary | ICD-10-CM | POA: Insufficient documentation

## 2018-01-03 IMAGING — US US MFM OB COMP +14 WKS
1 series · 14 of 28 positions shown · non-contrast
Comparison: none

[Series 1: us mfm ob comp +14 wks · 62 acquisitions, 14 frames shown]
[im 3/62]
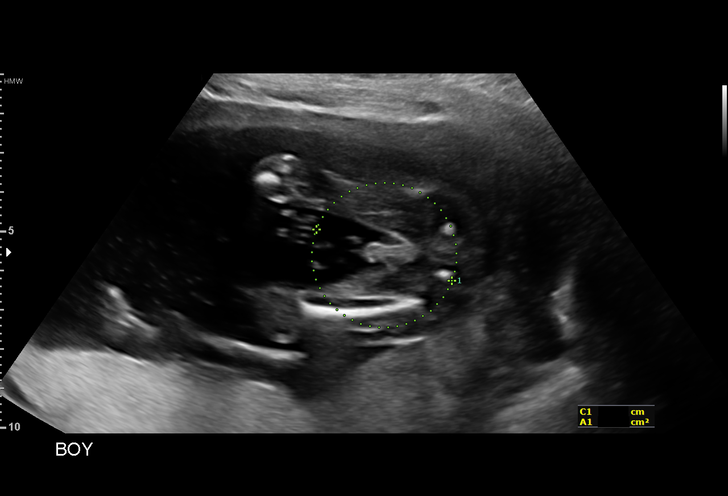
[im 7/62]
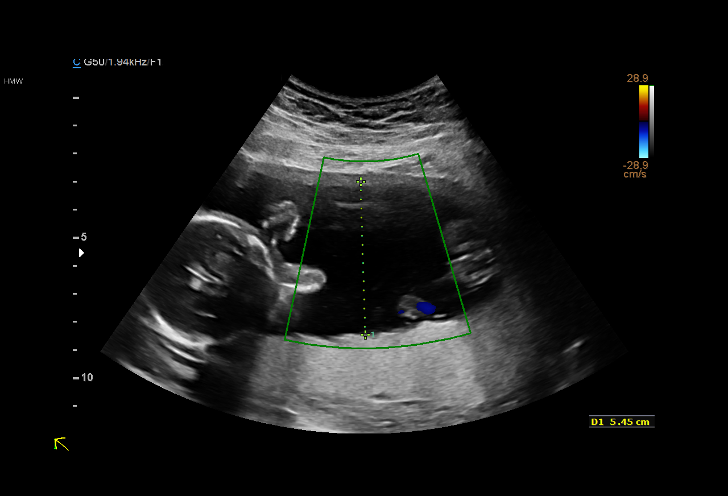
[im 12/62]
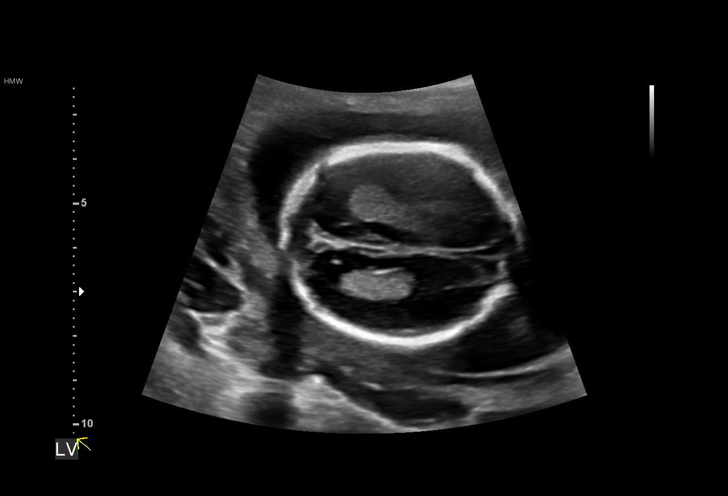
[im 16/62]
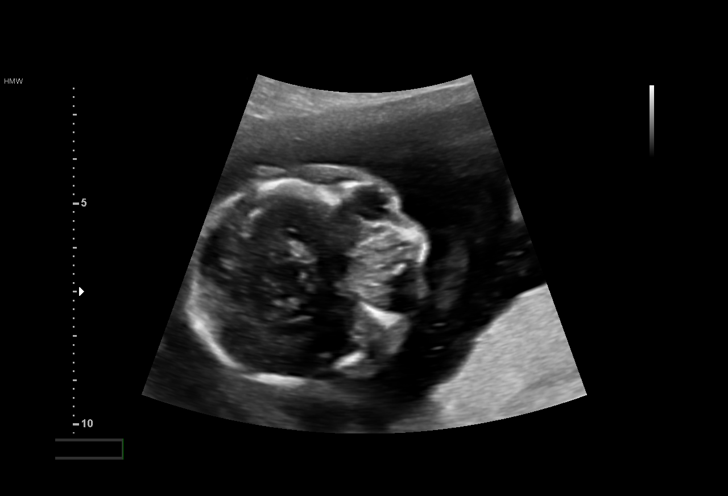
[im 21/62]
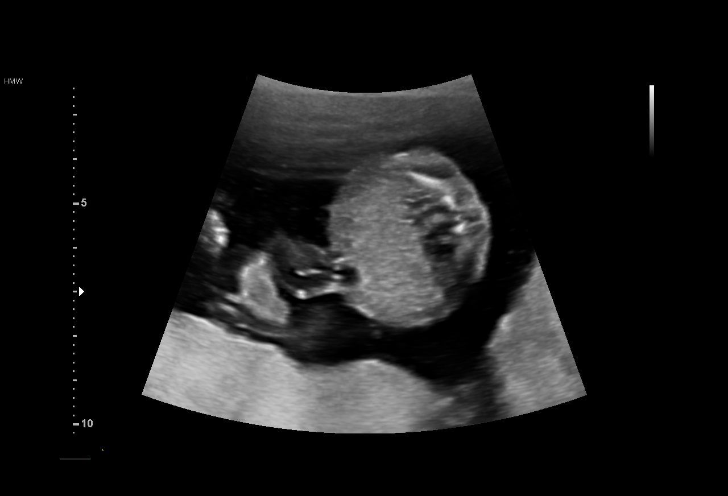
[im 25/62]
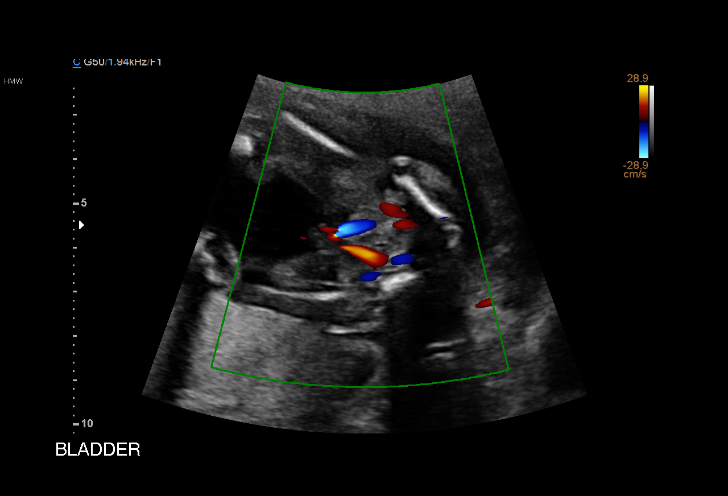
[im 30/62]
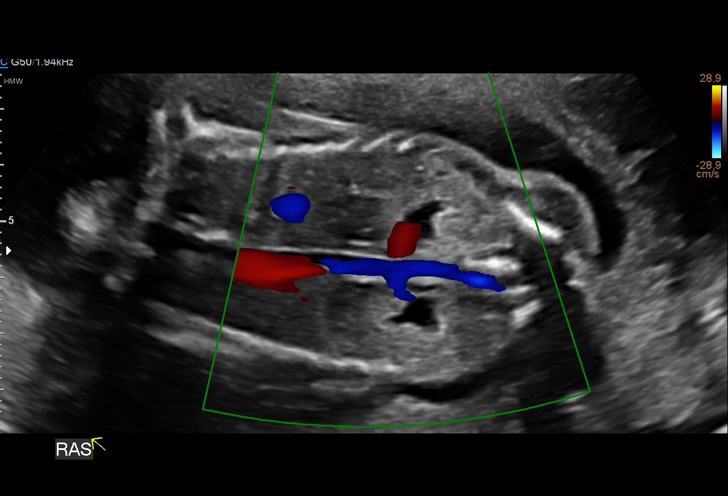
[im 34/62]
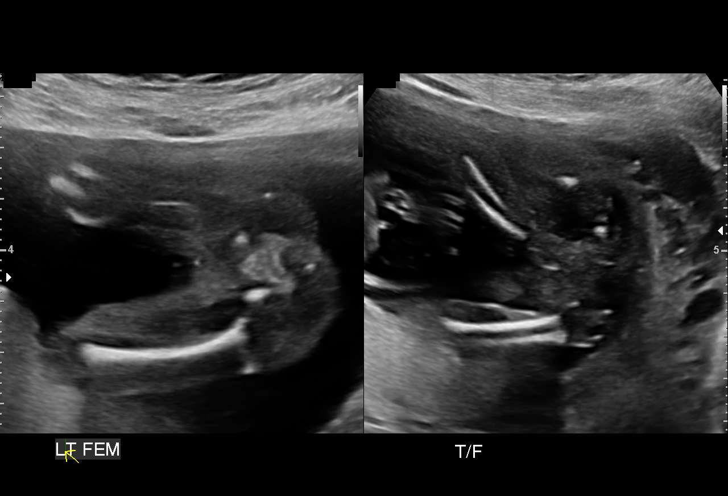
[im 39/62]
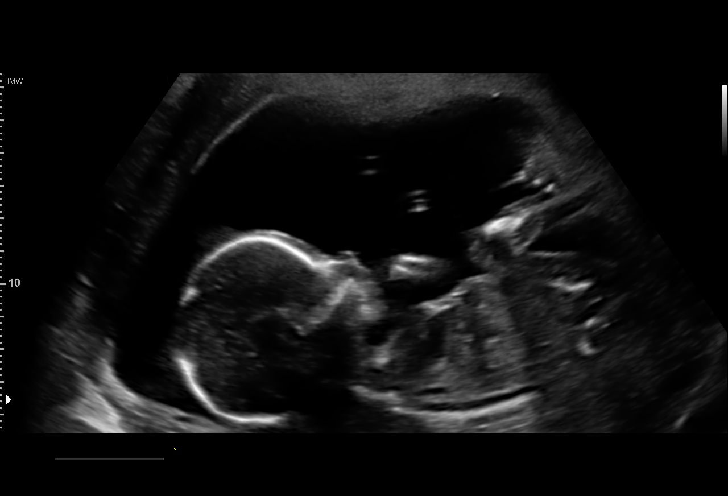
[im 43/62]
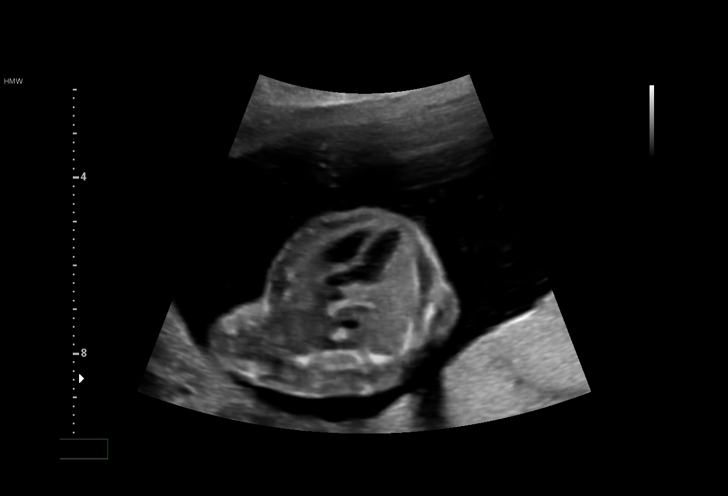
[im 48/62]
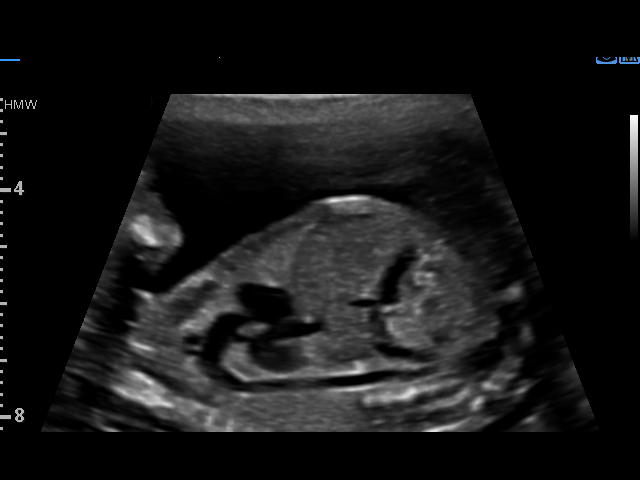
[im 52/62]
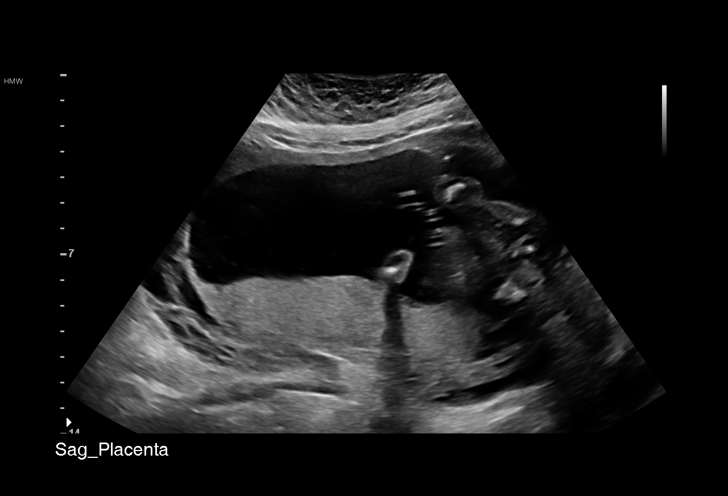
[im 57/62]
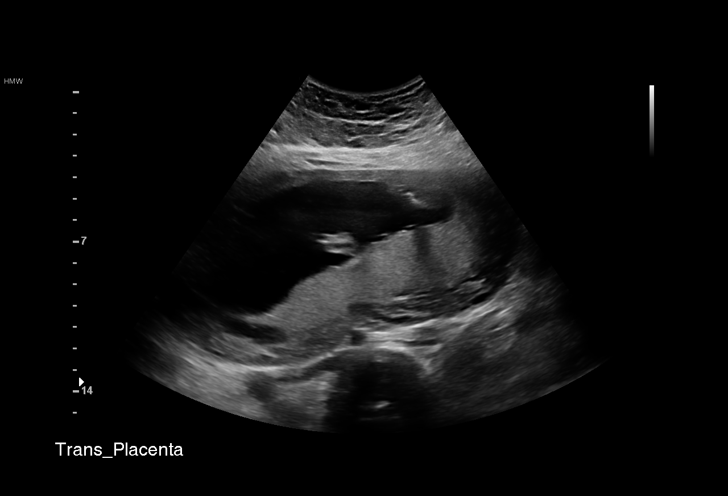
[im 62/62]
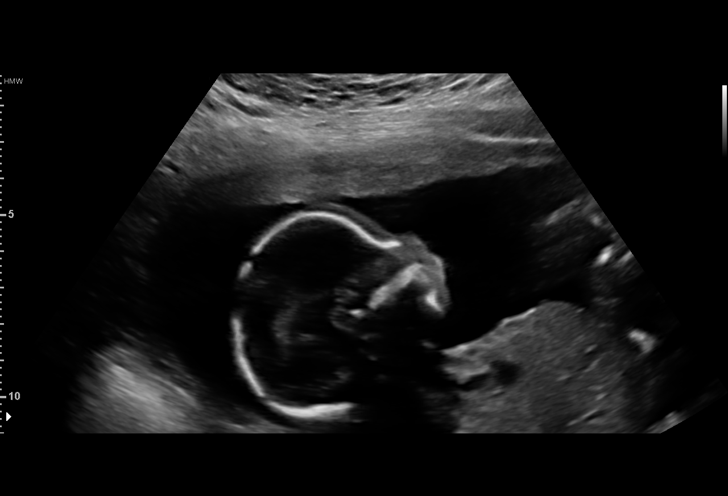

[14 of 28 positions shown; findings below may reference images not displayed]

1  RAYNARD JOSEPH           [id number]      [id number]     [id number]
Indications

19 weeks gestation of pregnancy
OB History

Blood Type:            Height:  5'7"   Weight (lb):  171       BMI:
Gravidity:    3         Term:   1        Prem:   0        SAB:   1
TOP:          0       Ectopic:  0        Living: 1
Fetal Evaluation

Num Of Fetuses:     1
Fetal Heart         138
Rate(bpm):
Cardiac Activity:   Observed
Presentation:       Breech
Placenta:           Posterior, above cervical os
P. Cord Insertion:  Visualized, central

Amniotic Fluid
AFI FV:      Subjectively within normal limits

Largest Pocket(cm)
5.5
Biometry

BPD:      46.7  mm     G. Age:  20w 1d         86  %    CI:        75.68   %    70 - 86
FL/HC:      16.2   %    16.1 -
HC:      170.2  mm     G. Age:  19w 4d         66  %    HC/AC:      1.15        1.09 -
AC:      148.3  mm     G. Age:  20w 1d         76  %    FL/BPD:     58.9   %
FL:       27.5  mm     G. Age:  18w 3d         20  %    FL/AC:      18.5   %    20 - 24
HUM:      28.5  mm     G. Age:  19w 1d         52  %
Est. FW:     289  gm    0 lb 10 oz      50  %
Gestational Age

LMP:           19w 1d        Date:  07/12/17                 EDD:   04/18/18
U/S Today:     19w 4d                                        EDD:   04/15/18
Best:          19w 1d     Det. By:  LMP  (07/12/17)          EDD:   04/18/18
Anatomy

Cranium:               Appears normal         Aortic Arch:            Appears normal
Cavum:                 Appears normal         Ductal Arch:            Not well visualized
Ventricles:            Appears normal         Diaphragm:              Appears normal
Choroid Plexus:        Appears normal         Stomach:                Appears normal, left
sided
Cerebellum:            Appears normal         Abdomen:                Appears normal
Posterior Fossa:       Appears normal         Abdominal Wall:         Appears nml (cord
insert, abd wall)
Nuchal Fold:           Appears normal         Cord Vessels:           Appears normal (3
vessel cord)
Face:                  Appears normal         Kidneys:                Bilat pyelectasis,
(orbits and profile)
Rt 5mm, Lt 5mm
Lips:                  Appears normal         Bladder:                Appears normal
Thoracic:              Appears normal         Spine:                  Not well visualized
Heart:                 Not well visualized    Upper Extremities:      Appears normal
RVOT:                  Not well visualized    Lower Extremities:      Appears normal
LVOT:                  Appears normal

Other:  Fetus appears to be a male. Nasal bone visualized. Technically
difficult due to fetal position.
Cervix Uterus Adnexa

Cervix
Length:            4.6  cm.
Normal appearance by transabdominal scan.

Uterus
No abnormality visualized.

Left Ovary
No adnexal mass visualized.

Right Ovary
No adnexal mass visualized.

Cul De Sac:   No free fluid seen.

Adnexa:       No abnormality visualized.
Impression

Singleton intrauterine pregnancy at 19+1 weeks, here for
anatomic survey
Review of the anatomy shows no sonographic markers for
aneuploidy or structural anomalies. there is borderline
pyelectasis of 5mm in each renal pelvis (UTD A1)
The cardiac views should be considered suboptimal
secondary to fetal position
Amniotic fluid volume is normal
Estimated fetal weight is 289g which is growth in the 50th
percentile
Recommendations

Recommend follow-up ultrasound examination in 4 weeks to
"clear" cardiac anantomy and reassess fetal renal status

## 2018-01-08 ENCOUNTER — Encounter: Payer: Self-pay | Admitting: Obstetrics & Gynecology

## 2018-01-08 ENCOUNTER — Telehealth: Payer: Self-pay | Admitting: *Deleted

## 2018-01-08 MED ORDER — OSELTAMIVIR PHOSPHATE 75 MG PO CAPS: 75.0000 mg | ORAL_CAPSULE | Freq: Every day | ORAL | 0 refills | Status: DC

## 2018-01-08 NOTE — Telephone Encounter (Signed)
Sent Tamiflu for prevention

## 2018-01-08 NOTE — Telephone Encounter (Signed)
-----   Message from Blanchie Dessert, [place] sent at 01/08/2018 10:35 AM EST ----- Regarding: med request  Daughter tested positive for Flu, would like something to prevent it called into [facility] in [place], pt is [redacted] wks pregnant

## 2018-01-14 ENCOUNTER — Encounter: Payer: Self-pay | Admitting: Obstetrics & Gynecology

## 2018-01-28 ENCOUNTER — Other Ambulatory Visit: Payer: Self-pay

## 2018-01-28 ENCOUNTER — Ambulatory Visit (INDEPENDENT_AMBULATORY_CARE_PROVIDER_SITE_OTHER): Payer: 59 | Admitting: Family Medicine

## 2018-01-28 VITALS — BP 97/67 | HR 72 | Wt 186.3 lb

## 2018-01-28 DIAGNOSIS — O358XX0 Maternal care for other (suspected) fetal abnormality and damage, not applicable or unspecified: Secondary | ICD-10-CM

## 2018-01-28 DIAGNOSIS — Z23 Encounter for immunization: Secondary | ICD-10-CM

## 2018-01-28 DIAGNOSIS — Z6791 Unspecified blood type, Rh negative: Secondary | ICD-10-CM

## 2018-01-28 DIAGNOSIS — O35EXX0 Maternal care for other (suspected) fetal abnormality and damage, fetal genitourinary anomalies, not applicable or unspecified: Secondary | ICD-10-CM

## 2018-01-28 DIAGNOSIS — O09893 Supervision of other high risk pregnancies, third trimester: Secondary | ICD-10-CM

## 2018-01-28 DIAGNOSIS — Z3402 Encounter for supervision of normal first pregnancy, second trimester: Secondary | ICD-10-CM

## 2018-01-28 DIAGNOSIS — O26899 Other specified pregnancy related conditions, unspecified trimester: Secondary | ICD-10-CM

## 2018-01-28 MED ORDER — RHO D IMMUNE GLOBULIN 1500 UNIT/2ML IJ SOSY
300.0000 ug | PREFILLED_SYRINGE | Freq: Once | INTRAMUSCULAR | Status: AC
  Administered 2018-01-28: 300 ug via INTRAMUSCULAR

## 2018-01-28 NOTE — Progress Notes (Signed)
tdap today rhogram today 28 week labs today

## 2018-01-28 NOTE — Addendum Note (Signed)
Addended by: Phillip Heal, DEMETRICE A on: 01/28/2018 05:08 PM   Modules accepted: Orders

## 2018-01-28 NOTE — Addendum Note (Signed)
Addended by: Phillip Heal, Suheyla Mortellaro A on: 01/28/2018 05:01 PM   Modules accepted: Orders

## 2018-01-28 NOTE — Addendum Note (Signed)
Addended by: Phillip Heal, DEMETRICE A on: 01/28/2018 05:07 PM   Modules accepted: Orders

## 2018-01-28 NOTE — Progress Notes (Signed)
   PRENATAL VISIT NOTE  Subjective:  Secily Walthour is a 32 y.o. G3P1011 at [redacted]w[redacted]d being seen today for ongoing prenatal care.  She is currently monitored for the following issues for this low-risk pregnancy and has Migraines; Chronic headache disorder; Cervicocranial syndrome; Musculoskeletal disorder of neck; Osteoarthritis; Rh negative state in antepartum period; Supervision of normal pregnancy; and Pyelectasis of fetus on prenatal ultrasound on their problem list.  Patient reports no complaints.   .  .  Movement: Present. Denies leaking of fluid.   The following portions of the patient's history were reviewed and updated as appropriate: allergies, current medications, past family history, past medical history, past social history, past surgical history and problem list. Problem list updated.  Objective:   Vitals:   01/28/18 0825  BP: 97/67  Pulse: 72  Weight: 186 lb 4.8 oz (84.5 kg)    Fetal Status: Fetal Heart Rate (bpm): 126   Movement: Present   Fundal Height 27  General:  Alert, oriented and cooperative. Patient is in no acute distress.  Skin: Skin is warm and dry. No rash noted.   Cardiovascular: Normal heart rate noted  Respiratory: Normal respiratory effort, no problems with respiration noted  Abdomen: Soft, gravid, appropriate for gestational age.  Pain/Pressure: Absent     Pelvic: Cervical exam deferred        Extremities: Normal range of motion.     Mental Status:  Normal mood and affect. Normal behavior. Normal judgment and thought content.   Assessment and Plan:  Pregnancy: G3P1011 at [redacted]w[redacted]d  1. Encounter for supervision of normal first pregnancy in second trimester 28 wk labs today - Glucose tolerance, 2 hours; Future - RPR; Future - HIV antibody; Future - CBC; Future - Tdap vaccine greater than or equal to 7yo IM  2. Rh negative state in antepartum period S/p RHogam today - rho (d) immune globulin (RHIG/RHOPHYLAC) injection 300 mcg  3. Pyelectasis of  fetus on prenatal ultrasound F/u at 30-32 wks scheduled. - [place] MFM OB FOLLOW UP; Future  Preterm labor symptoms and general obstetric precautions including but not limited to vaginal bleeding, contractions, leaking of fluid and fetal movement were reviewed in detail with the patient. Please refer to After Visit Summary for other counseling recommendations.  Return in 4 weeks (on 02/25/2018).   Donnamae Jude, MD

## 2018-01-28 NOTE — Patient Instructions (Signed)
 Third Trimester of Pregnancy The third trimester is from week 28 through week 40 (months 7 through 9). The third trimester is a time when the unborn baby (fetus) is growing rapidly. At the end of the ninth month, the fetus is about 20 inches in length and weighs 6-10 pounds. Body changes during your third trimester Your body will continue to go through many changes during pregnancy. The changes vary from woman to woman. During the third trimester:  Your weight will continue to increase. You can expect to gain 25-35 pounds (11-16 kg) by the end of the pregnancy.  You may begin to get stretch marks on your hips, abdomen, and breasts.  You may urinate more often because the fetus is moving lower into your pelvis and pressing on your bladder.  You may develop or continue to have heartburn. This is caused by increased hormones that slow down muscles in the digestive tract.  You may develop or continue to have constipation because increased hormones slow digestion and cause the muscles that push waste through your intestines to relax.  You may develop hemorrhoids. These are swollen veins (varicose veins) in the rectum that can itch or be painful.  You may develop swollen, bulging veins (varicose veins) in your legs.  You may have increased body aches in the pelvis, back, or thighs. This is due to weight gain and increased hormones that are relaxing your joints.  You may have changes in your hair. These can include thickening of your hair, rapid growth, and changes in texture. Some women also have hair loss during or after pregnancy, or hair that feels dry or thin. Your hair will most likely return to normal after your baby is born.  Your breasts will continue to grow and they will continue to become tender. A yellow fluid (colostrum) may leak from your breasts. This is the first milk you are producing for your baby.  Your belly button may stick out.  You may notice more swelling in your  hands, face, or ankles.  You may have increased tingling or numbness in your hands, arms, and legs. The skin on your belly may also feel numb.  You may feel short of breath because of your expanding uterus.  You may have more problems sleeping. This can be caused by the size of your belly, increased need to urinate, and an increase in your body's metabolism.  You may notice the fetus "dropping," or moving lower in your abdomen (lightening).  You may have increased vaginal discharge.  You may notice your joints feel loose and you may have pain around your pelvic bone.  What to expect at prenatal visits You will have prenatal exams every 2 weeks until week 36. Then you will have weekly prenatal exams. During a routine prenatal visit:  You will be weighed to make sure you and the baby are growing normally.  Your blood pressure will be taken.  Your abdomen will be measured to track your baby's growth.  The fetal heartbeat will be listened to.  Any test results from the previous visit will be discussed.  You may have a cervical check near your due date to see if your cervix has softened or thinned (effaced).  You will be tested for Group B streptococcus. This happens between 35 and 37 weeks.  Your health care provider may ask you:  What your birth plan is.  How you are feeling.  If you are feeling the baby move.  If you have   had any abnormal symptoms, such as leaking fluid, bleeding, severe headaches, or abdominal cramping.  If you are using any tobacco products, including cigarettes, chewing tobacco, and electronic cigarettes.  If you have any questions.  Other tests or screenings that may be performed during your third trimester include:  Blood tests that check for low iron levels (anemia).  Fetal testing to check the health, activity level, and growth of the fetus. Testing is done if you have certain medical conditions or if there are problems during the  pregnancy.  Nonstress test (NST). This test checks the health of your baby to make sure there are no signs of problems, such as the baby not getting enough oxygen. During this test, a belt is placed around your belly. The baby is made to move, and its heart rate is monitored during movement.  What is false labor? False labor is a condition in which you feel small, irregular tightenings of the muscles in the womb (contractions) that usually go away with rest, changing position, or drinking water. These are called Braxton Hicks contractions. Contractions may last for hours, days, or even weeks before true labor sets in. If contractions come at regular intervals, become more frequent, increase in intensity, or become painful, you should see your health care provider. What are the signs of labor?  Abdominal cramps.  Regular contractions that start at 10 minutes apart and become stronger and more frequent with time.  Contractions that start on the top of the uterus and spread down to the lower abdomen and back.  Increased pelvic pressure and dull back pain.  A watery or bloody mucus discharge that comes from the vagina.  Leaking of amniotic fluid. This is also known as your "water breaking." It could be a slow trickle or a gush. Let your health care provider know if it has a color or strange odor. If you have any of these signs, call your health care provider right away, even if it is before your due date. Follow these instructions at home: Medicines  Follow your health care provider's instructions regarding medicine use. Specific medicines may be either safe or unsafe to take during pregnancy.  Take a prenatal vitamin that contains at least 600 micrograms (mcg) of folic acid.  If you develop constipation, try taking a stool softener if your health care provider approves. Eating and drinking  Eat a balanced diet that includes fresh fruits and vegetables, whole grains, good sources of protein  such as meat, eggs, or tofu, and low-fat dairy. Your health care provider will help you determine the amount of weight gain that is right for you.  Avoid raw meat and uncooked cheese. These carry germs that can cause birth defects in the baby.  If you have low calcium intake from food, talk to your health care provider about whether you should take a daily calcium supplement.  Eat four or five small meals rather than three large meals a day.  Limit foods that are high in fat and processed sugars, such as fried and sweet foods.  To prevent constipation: ? Drink enough fluid to keep your urine clear or pale yellow. ? Eat foods that are high in fiber, such as fresh fruits and vegetables, whole grains, and beans. Activity  Exercise only as directed by your health care provider. Most women can continue their usual exercise routine during pregnancy. Try to exercise for 30 minutes at least 5 days a week. Stop exercising if you experience uterine contractions.  Avoid   heavy lifting.  Do not exercise in extreme heat or humidity, or at high altitudes.  Wear low-heel, comfortable shoes.  Practice good posture.  You may continue to have sex unless your health care provider tells you otherwise. Relieving pain and discomfort  Take frequent breaks and rest with your legs elevated if you have leg cramps or low back pain.  Take warm sitz baths to soothe any pain or discomfort caused by hemorrhoids. Use hemorrhoid cream if your health care provider approves.  Wear a good support bra to prevent discomfort from breast tenderness.  If you develop varicose veins: ? Wear support pantyhose or compression stockings as told by your healthcare provider. ? Elevate your feet for 15 minutes, 3-4 times a day. Prenatal care  Write down your questions. Take them to your prenatal visits.  Keep all your prenatal visits as told by your health care provider. This is important. Safety  Wear your seat belt at  all times when driving.  Make a list of emergency phone numbers, including numbers for family, friends, the hospital, and police and fire departments. General instructions  Avoid cat litter boxes and soil used by cats. These carry germs that can cause birth defects in the baby. If you have a cat, ask someone to clean the litter box for you.  Do not travel far distances unless it is absolutely necessary and only with the approval of your health care provider.  Do not use hot tubs, steam rooms, or saunas.  Do not drink alcohol.  Do not use any products that contain nicotine or tobacco, such as cigarettes and e-cigarettes. If you need help quitting, ask your health care provider.  Do not use any medicinal herbs or unprescribed drugs. These chemicals affect the formation and growth of the baby.  Do not douche or use tampons or scented sanitary pads.  Do not cross your legs for long periods of time.  To prepare for the arrival of your baby: ? Take prenatal classes to understand, practice, and ask questions about labor and delivery. ? Make a trial run to the hospital. ? Visit the hospital and tour the maternity area. ? Arrange for maternity or paternity leave through employers. ? Arrange for family and friends to take care of pets while you are in the hospital. ? Purchase a rear-facing car seat and make sure you know how to install it in your car. ? Pack your hospital bag. ? Prepare the baby's nursery. Make sure to remove all pillows and stuffed animals from the baby's crib to prevent suffocation.  Visit your dentist if you have not gone during your pregnancy. Use a soft toothbrush to brush your teeth and be gentle when you floss. Contact a health care provider if:  You are unsure if you are in labor or if your water has broken.  You become dizzy.  You have mild pelvic cramps, pelvic pressure, or nagging pain in your abdominal area.  You have lower back pain.  You have persistent  nausea, vomiting, or diarrhea.  You have an unusual or bad smelling vaginal discharge.  You have pain when you urinate. Get help right away if:  Your water breaks before 37 weeks.  You have regular contractions less than 5 minutes apart before 37 weeks.  You have a fever.  You are leaking fluid from your vagina.  You have spotting or bleeding from your vagina.  You have severe abdominal pain or cramping.  You have rapid weight loss or weight   gain.  You have shortness of breath with chest pain.  You notice sudden or extreme swelling of your face, hands, ankles, feet, or legs.  Your baby makes fewer than 10 movements in 2 hours.  You have severe headaches that do not go away when you take medicine.  You have vision changes. Summary  The third trimester is from week 28 through week 40, months 7 through 9. The third trimester is a time when the unborn baby (fetus) is growing rapidly.  During the third trimester, your discomfort may increase as you and your baby continue to gain weight. You may have abdominal, leg, and back pain, sleeping problems, and an increased need to urinate.  During the third trimester your breasts will keep growing and they will continue to become tender. A yellow fluid (colostrum) may leak from your breasts. This is the first milk you are producing for your baby.  False labor is a condition in which you feel small, irregular tightenings of the muscles in the womb (contractions) that eventually go away. These are called Braxton Hicks contractions. Contractions may last for hours, days, or even weeks before true labor sets in.  Signs of labor can include: abdominal cramps; regular contractions that start at 10 minutes apart and become stronger and more frequent with time; watery or bloody mucus discharge that comes from the vagina; increased pelvic pressure and dull back pain; and leaking of amniotic fluid. This information is not intended to replace advice  given to you by your health care provider. Make sure you discuss any questions you have with your health care provider. Document Released: 11/18/2001 Document Revised: 05/01/2016 Document Reviewed: 01/25/2013 Elsevier Interactive Patient Education  2017 [place].   Breastfeeding Choosing to breastfeed is one of the best decisions you can make for yourself and your baby. A change in hormones during pregnancy causes your breasts to make breast milk in your milk-producing glands. Hormones prevent breast milk from being released before your baby is born. They also prompt milk flow after birth. Once breastfeeding has begun, thoughts of your baby, as well as his or her sucking or crying, can stimulate the release of milk from your milk-producing glands. Benefits of breastfeeding Research shows that breastfeeding offers many health benefits for infants and mothers. It also offers a cost-free and convenient way to feed your baby. For your baby  Your first milk (colostrum) helps your baby's digestive system to function better.  Special cells in your milk (antibodies) help your baby to fight off infections.  Breastfed babies are less likely to develop asthma, allergies, obesity, or type 2 diabetes. They are also at lower risk for sudden infant death syndrome (SIDS).  Nutrients in breast milk are better able to meet your baby's needs compared to infant formula.  Breast milk improves your baby's brain development. For you  Breastfeeding helps to create a very special bond between you and your baby.  Breastfeeding is convenient. Breast milk costs nothing and is always available at the correct temperature.  Breastfeeding helps to burn calories. It helps you to lose the weight that you gained during pregnancy.  Breastfeeding makes your uterus return faster to its size before pregnancy. It also slows bleeding (lochia) after you give birth.  Breastfeeding helps to lower your risk of developing type  2 diabetes, osteoporosis, rheumatoid arthritis, cardiovascular disease, and breast, ovarian, uterine, and endometrial cancer later in life. Breastfeeding basics Starting breastfeeding  Find a comfortable place to sit or lie down, with your  neck and back well-supported.  Place a pillow or a rolled-up blanket under your baby to bring him or her to the level of your breast (if you are seated). Nursing pillows are specially designed to help support your arms and your baby while you breastfeed.  Make sure that your baby's tummy (abdomen) is facing your abdomen.  Gently massage your breast. With your fingertips, massage from the outer edges of your breast inward toward the nipple. This encourages milk flow. If your milk flows slowly, you may need to continue this action during the feeding.  Support your breast with 4 fingers underneath and your thumb above your nipple (make the letter "C" with your hand). Make sure your fingers are well away from your nipple and your baby's mouth.  Stroke your baby's lips gently with your finger or nipple.  When your baby's mouth is open wide enough, quickly bring your baby to your breast, placing your entire nipple and as much of the areola as possible into your baby's mouth. The areola is the colored area around your nipple. ? More areola should be visible above your baby's upper lip than below the lower lip. ? Your baby's lips should be opened and extended outward (flanged) to ensure an adequate, comfortable latch. ? Your baby's tongue should be between his or her lower gum and your breast.  Make sure that your baby's mouth is correctly positioned around your nipple (latched). Your baby's lips should create a seal on your breast and be turned out (everted).  It is common for your baby to suck about 2-3 minutes in order to start the flow of breast milk. Latching Teaching your baby how to latch onto your breast properly is very important. An improper latch can  cause nipple pain, decreased milk supply, and poor weight gain in your baby. Also, if your baby is not latched onto your nipple properly, he or she may swallow some air during feeding. This can make your baby fussy. Burping your baby when you switch breasts during the feeding can help to get rid of the air. However, teaching your baby to latch on properly is still the best way to prevent fussiness from swallowing air while breastfeeding. Signs that your baby has successfully latched onto your nipple  Silent tugging or silent sucking, without causing you pain. Infant's lips should be extended outward (flanged).  Swallowing heard between every 3-4 sucks once your milk has started to flow (after your let-down milk reflex occurs).  Muscle movement above and in front of his or her ears while sucking.  Signs that your baby has not successfully latched onto your nipple  Sucking sounds or smacking sounds from your baby while breastfeeding.  Nipple pain.  If you think your baby has not latched on correctly, slip your finger into the corner of your baby's mouth to break the suction and place it between your baby's gums. Attempt to start breastfeeding again. Signs of successful breastfeeding Signs from your baby  Your baby will gradually decrease the number of sucks or will completely stop sucking.  Your baby will fall asleep.  Your baby's body will relax.  Your baby will retain a small amount of milk in his or her mouth.  Your baby will let go of your breast by himself or herself.  Signs from you  Breasts that have increased in firmness, weight, and size 1-3 hours after feeding.  Breasts that are softer immediately after breastfeeding.  Increased milk volume, as well as a  change in milk consistency and color by the fifth day of breastfeeding.  Nipples that are not sore, cracked, or bleeding.  Signs that your baby is getting enough milk  Wetting at least 1-2 diapers during the first 24  hours after birth.  Wetting at least 5-6 diapers every 24 hours for the first week after birth. The urine should be clear or pale yellow by the age of 5 days.  Wetting 6-8 diapers every 24 hours as your baby continues to grow and develop.  At least 3 stools in a 24-hour period by the age of 5 days. The stool should be soft and yellow.  At least 3 stools in a 24-hour period by the age of 7 days. The stool should be seedy and yellow.  No loss of weight greater than 10% of birth weight during the first 3 days of life.  Average weight gain of 4-7 oz (113-198 g) per week after the age of 4 days.  Consistent daily weight gain by the age of 5 days, without weight loss after the age of 2 weeks. After a feeding, your baby may spit up a small amount of milk. This is normal. Breastfeeding frequency and duration Frequent feeding will help you make more milk and can prevent sore nipples and extremely full breasts (breast engorgement). Breastfeed when you feel the need to reduce the fullness of your breasts or when your baby shows signs of hunger. This is called "breastfeeding on demand." Signs that your baby is hungry include:  Increased alertness, activity, or restlessness.  Movement of the head from side to side.  Opening of the mouth when the corner of the mouth or cheek is stroked (rooting).  Increased sucking sounds, smacking lips, cooing, sighing, or squeaking.  Hand-to-mouth movements and sucking on fingers or hands.  Fussing or crying.  Avoid introducing a pacifier to your baby in the first 4-6 weeks after your baby is born. After this time, you may choose to use a pacifier. Research has shown that pacifier use during the first year of a baby's life decreases the risk of sudden infant death syndrome (SIDS). Allow your baby to feed on each breast as long as he or she wants. When your baby unlatches or falls asleep while feeding from the first breast, offer the second breast. Because  newborns are often sleepy in the first few weeks of life, you may need to awaken your baby to get him or her to feed. Breastfeeding times will vary from baby to baby. However, the following rules can serve as a guide to help you make sure that your baby is properly fed:  Newborns (babies 70 weeks of age or younger) may breastfeed every 1-3 hours.  Newborns should not go without breastfeeding for longer than 3 hours during the day or 5 hours during the night.  You should breastfeed your baby a minimum of 8 times in a 24-hour period.  Breast milk pumping Pumping and storing breast milk allows you to make sure that your baby is exclusively fed your breast milk, even at times when you are unable to breastfeed. This is especially important if you go back to work while you are still breastfeeding, or if you are not able to be present during feedings. Your lactation consultant can help you find a method of pumping that works best for you and give you guidelines about how long it is safe to store breast milk. Caring for your breasts while you breastfeed Nipples can become  dry, cracked, and sore while breastfeeding. The following recommendations can help keep your breasts moisturized and healthy:  Avoid using soap on your nipples.  Wear a supportive bra designed especially for nursing. Avoid wearing underwire-style bras or extremely tight bras (sports bras).  Air-dry your nipples for 3-4 minutes after each feeding.  Use only cotton bra pads to absorb leaked breast milk. Leaking of breast milk between feedings is normal.  Use lanolin on your nipples after breastfeeding. Lanolin helps to maintain your skin's normal moisture barrier. Pure lanolin is not harmful (not toxic) to your baby. You may also hand express a few drops of breast milk and gently massage that milk into your nipples and allow the milk to air-dry.  In the first few weeks after giving birth, some women experience breast engorgement.  Engorgement can make your breasts feel heavy, warm, and tender to the touch. Engorgement peaks within 3-5 days after you give birth. The following recommendations can help to ease engorgement:  Completely empty your breasts while breastfeeding or pumping. You may want to start by applying warm, moist heat (in the shower or with warm, water-soaked hand towels) just before feeding or pumping. This increases circulation and helps the milk flow. If your baby does not completely empty your breasts while breastfeeding, pump any extra milk after he or she is finished.  Apply ice packs to your breasts immediately after breastfeeding or pumping, unless this is too uncomfortable for you. To do this: ? Put ice in a plastic bag. ? Place a towel between your skin and the bag. ? Leave the ice on for 20 minutes, 2-3 times a day.  Make sure that your baby is latched on and positioned properly while breastfeeding.  If engorgement persists after 48 hours of following these recommendations, contact your health care provider or a Science writer. Overall health care recommendations while breastfeeding  Eat 3 healthy meals and 3 snacks every day. Well-nourished mothers who are breastfeeding need an additional 450-500 calories a day. You can meet this requirement by increasing the amount of a balanced diet that you eat.  Drink enough water to keep your urine pale yellow or clear.  Rest often, relax, and continue to take your prenatal vitamins to prevent fatigue, stress, and low vitamin and mineral levels in your body (nutrient deficiencies).  Do not use any products that contain nicotine or tobacco, such as cigarettes and e-cigarettes. Your baby may be harmed by chemicals from cigarettes that pass into breast milk and exposure to secondhand smoke. If you need help quitting, ask your health care provider.  Avoid alcohol.  Do not use illegal drugs or marijuana.  Talk with your health care provider before  taking any medicines. These include over-the-counter and prescription medicines as well as vitamins and herbal supplements. Some medicines that may be harmful to your baby can pass through breast milk.  It is possible to become pregnant while breastfeeding. If birth control is desired, ask your health care provider about options that will be safe while breastfeeding your baby. Where to find more information: [employer]: www.llli.org Contact a health care provider if:  You feel like you want to stop breastfeeding or have become frustrated with breastfeeding.  Your nipples are cracked or bleeding.  Your breasts are red, tender, or warm.  You have: ? Painful breasts or nipples. ? A swollen area on either breast. ? A fever or chills. ? Nausea or vomiting. ? Drainage other than breast milk from your  breasts do not become full before feedings by the fifth day after you give birth.  You feel sad and depressed.  Your baby is: ? Too sleepy to eat well. ? Having trouble sleeping. ? More than 1 week old and wetting fewer than 6 diapers in a 24-hour period. ? Not gaining weight by 5 days of age.  Your baby has fewer than 3 stools in a 24-hour period.  Your baby's skin or the white parts of his or her eyes become yellow. Get help right away if:  Your baby is overly tired (lethargic) and does not want to wake up and feed.  Your baby develops an unexplained fever. Summary  Breastfeeding offers many health benefits for infant and mothers.  Try to breastfeed your infant when he or she shows early signs of hunger.  Gently tickle or stroke your baby's lips with your finger or nipple to allow the baby to open his or her mouth. Bring the baby to your breast. Make sure that much of the areola is in your baby's mouth. Offer one side and burp the baby before you offer the other side.  Talk with your health care provider or lactation consultant if you have  questions or you face problems as you breastfeed. This information is not intended to replace advice given to you by your health care provider. Make sure you discuss any questions you have with your health care provider. Document Released: 11/24/2005 Document Revised: 12/26/2016 Document Reviewed: 12/26/2016 Elsevier Interactive Patient Education  2018 Elsevier Inc.  

## 2018-01-29 LAB — CBC
HEMOGLOBIN: 10.8 g/dL — AB (ref 11.1–15.9)
Hematocrit: 31.2 % — ABNORMAL LOW (ref 34.0–46.6)
MCH: 30 pg (ref 26.6–33.0)
MCHC: 34.6 g/dL (ref 31.5–35.7)
MCV: 87 fL (ref 79–97)
Platelets: 276 10*3/uL (ref 150–379)
RBC: 3.6 x10E6/uL — AB (ref 3.77–5.28)
RDW: 13.2 % (ref 12.3–15.4)
WBC: 8.5 10*3/uL (ref 3.4–10.8)

## 2018-01-29 LAB — HIV ANTIBODY (ROUTINE TESTING W REFLEX): HIV Screen 4th Generation wRfx: NONREACTIVE

## 2018-01-29 LAB — GLUCOSE TOLERANCE, 2 HOURS W/ 1HR
GLUCOSE, 1 HOUR: 110 mg/dL (ref 65–179)
Glucose, 2 hour: 91 mg/dL (ref 65–152)
Glucose, Fasting: 73 mg/dL (ref 65–91)

## 2018-01-29 LAB — RPR: RPR: NONREACTIVE

## 2018-02-02 ENCOUNTER — Encounter: Payer: Self-pay | Admitting: Obstetrics & Gynecology

## 2018-02-03 ENCOUNTER — Ambulatory Visit (INDEPENDENT_AMBULATORY_CARE_PROVIDER_SITE_OTHER): Payer: 59 | Admitting: Student

## 2018-02-03 VITALS — BP 120/71 | HR 147 | Wt 190.0 lb

## 2018-02-03 DIAGNOSIS — Z6791 Unspecified blood type, Rh negative: Secondary | ICD-10-CM

## 2018-02-03 DIAGNOSIS — N898 Other specified noninflammatory disorders of vagina: Secondary | ICD-10-CM

## 2018-02-03 DIAGNOSIS — O26893 Other specified pregnancy related conditions, third trimester: Secondary | ICD-10-CM

## 2018-02-03 DIAGNOSIS — O09899 Supervision of other high risk pregnancies, unspecified trimester: Secondary | ICD-10-CM

## 2018-02-03 DIAGNOSIS — R12 Heartburn: Secondary | ICD-10-CM | POA: Insufficient documentation

## 2018-02-03 DIAGNOSIS — O26899 Other specified pregnancy related conditions, unspecified trimester: Secondary | ICD-10-CM

## 2018-02-03 DIAGNOSIS — Z3481 Encounter for supervision of other normal pregnancy, first trimester: Secondary | ICD-10-CM

## 2018-02-03 MED ORDER — RANITIDINE HCL 150 MG PO TABS: 150.0000 mg | ORAL_TABLET | Freq: Two times a day (BID) | ORAL | 1 refills | Status: DC

## 2018-02-03 MED ORDER — PROMETHAZINE HCL 25 MG PO TABS: 25.0000 mg | ORAL_TABLET | Freq: Four times a day (QID) | ORAL | 1 refills | Status: DC | PRN

## 2018-02-03 NOTE — Progress Notes (Signed)
Needs refill on her Phenergan. Having increased white discharge.

## 2018-02-03 NOTE — Patient Instructions (Signed)

## 2018-02-03 NOTE — Progress Notes (Signed)
   Subjective:    Patient ID: Carol [name], female    DOB: [birth year], 32 y.o.   MRN: [id number] Patient Carol [name] is a 32 y.o. G3P1011 Here with complaints of increased discharge yesterday, none today. She denies bleeding, leaking of fluid, decreased fetal movements. She is still taking tums but would like to add Zantac.   Vaginal Discharge  The patient's primary symptoms include vaginal discharge. The patient's pertinent negatives include no genital itching, genital lesions or genital rash. This is a new problem. The current episode started yesterday. The problem occurs intermittently. The problem has been resolved. The patient is experiencing no pain. Pertinent negatives include no abdominal pain, chills, constipation, diarrhea, painful intercourse, rash, urgency or vomiting. The vaginal discharge was clear. Nothing aggravates the symptoms. She has tried nothing for the symptoms.      Review of Systems  Constitutional: Negative for chills.  HENT: Negative.   Respiratory: Negative.   Cardiovascular: Negative.   Gastrointestinal: Negative for abdominal pain, constipation, diarrhea and vomiting.  Genitourinary: Positive for vaginal discharge. Negative for urgency.  Skin: Negative for rash.  Neurological: Negative.   Psychiatric/Behavioral: Negative.        Objective:   Physical Exam  Constitutional: She appears well-developed.  HENT:  Head: Normocephalic.  Neck: Normal range of motion.  Pulmonary/Chest: Effort normal.  Abdominal: Soft. Bowel sounds are normal.  Genitourinary:  Genitourinary Comments: Normal external female genitalia; no lesions or masses on cervix or vaginal walls. Thin watery discharge in the vagina; no odor. No pooling. Vaginal exam deferred.  Musculoskeletal: Normal range of motion.  Neurological: She is alert.  Skin: Skin is warm and dry.  Psychiatric: She has a normal mood and affect.    FHR is 145; feels positive fetal movements.      Assessment & Plan:   1. Vaginal discharge during pregnancy in third trimester   2. Encounter for supervision of other normal pregnancy in first trimester   3. Rh negative state in antepartum period   4. Heart burn      2. Wet prep done; results pending.  3. RX for Zantac given, as well as refill on Phenergan.  4. Patient did receive Rhogam at 28 weeks; plans to keep next ob appt and follow-up US.  5. All questions answered.

## 2018-02-08 LAB — CERVICOVAGINAL ANCILLARY ONLY
BACTERIAL VAGINITIS: NEGATIVE
Candida vaginitis: NEGATIVE

## 2018-02-15 ENCOUNTER — Encounter: Payer: Self-pay | Admitting: Obstetrics & Gynecology

## 2018-02-25 ENCOUNTER — Other Ambulatory Visit: Payer: Self-pay | Admitting: Family Medicine

## 2018-02-25 ENCOUNTER — Ambulatory Visit (INDEPENDENT_AMBULATORY_CARE_PROVIDER_SITE_OTHER): Payer: 59 | Admitting: Obstetrics and Gynecology

## 2018-02-25 ENCOUNTER — Ambulatory Visit (HOSPITAL_COMMUNITY)
Admission: RE | Admit: 2018-02-25 | Discharge: 2018-02-25 | Disposition: A | Discharge status: Home / Self Care | Payer: 59 | Source: Ambulatory Visit | Attending: Family Medicine | Admitting: Family Medicine

## 2018-02-25 VITALS — BP 104/65 | HR 72 | Wt 190.6 lb

## 2018-02-25 DIAGNOSIS — O358XX0 Maternal care for other (suspected) fetal abnormality and damage, not applicable or unspecified: Secondary | ICD-10-CM | POA: Diagnosis not present

## 2018-02-25 DIAGNOSIS — Z362 Encounter for other antenatal screening follow-up: Secondary | ICD-10-CM | POA: Insufficient documentation

## 2018-02-25 DIAGNOSIS — O9989 Other specified diseases and conditions complicating pregnancy, childbirth and the puerperium: Secondary | ICD-10-CM | POA: Insufficient documentation

## 2018-02-25 DIAGNOSIS — Z3480 Encounter for supervision of other normal pregnancy, unspecified trimester: Secondary | ICD-10-CM

## 2018-02-25 DIAGNOSIS — Z3A32 32 weeks gestation of pregnancy: Secondary | ICD-10-CM

## 2018-02-25 DIAGNOSIS — N1330 Unspecified hydronephrosis: Secondary | ICD-10-CM | POA: Diagnosis not present

## 2018-02-25 DIAGNOSIS — O35EXX0 Maternal care for other (suspected) fetal abnormality and damage, fetal genitourinary anomalies, not applicable or unspecified: Secondary | ICD-10-CM

## 2018-02-25 MED ORDER — FERROUS GLUCONATE 324 (38 FE) MG PO TABS: 324.0000 mg | ORAL_TABLET | Freq: Every day | ORAL | 3 refills | Status: DC

## 2018-02-25 NOTE — Progress Notes (Signed)
Prenatal Visit Note Date: 02/25/2018 Clinic: [facility]  Subjective:  Carol [name] is a 32 y.o. G3P1011 at [redacted]w[redacted]d being seen today for ongoing prenatal care.  She is currently monitored for the following issues for this low-risk pregnancy and has Migraines; Chronic headache disorder; Cervicocranial syndrome; Musculoskeletal disorder of neck; Osteoarthritis; Rh negative state in antepartum period; Supervision of normal pregnancy; Pyelectasis of fetus on prenatal ultrasound; Vaginal discharge during pregnancy in third trimester; and Heart burn on their problem list.  Patient reports no complaints.   Contractions: Not present. Vag. Bleeding: None.  Movement: Present. Denies leaking of fluid.   The following portions of the patient's history were reviewed and updated as appropriate: allergies, current medications, past family history, past medical history, past social history, past surgical history and problem list. Problem list updated.  Objective:   Vitals:   02/25/18 0847  BP: 104/65  Pulse: 72  Weight: 190 lb 9.6 oz (86.5 kg)    Fetal Status: Fetal Heart Rate (bpm): 145   Movement: Present     General:  Alert, oriented and cooperative. Patient is in no acute distress.  Skin: Skin is warm and dry. No rash noted.   Cardiovascular: Normal heart rate noted  Respiratory: Normal respiratory effort, no problems with respiration noted  Abdomen: Soft, gravid, appropriate for gestational age. Pain/Pressure: Present     Pelvic:  Cervical exam deferred        Extremities: Normal range of motion.     Mental Status: Normal mood and affect. Normal behavior. Normal judgment and thought content.   Urinalysis:      Assessment and Plan:  Pregnancy: G3P1011 at [redacted]w[redacted]d  1. Supervision of other normal pregnancy, antepartum F/u u/s to assess fetal pelviectasis today. Recommend qday iron  Preterm labor symptoms and general obstetric precautions including but not limited to  vaginal bleeding, contractions, leaking of fluid and fetal movement were reviewed in detail with the patient. Please refer to After Visit Summary for other counseling recommendations.  Return in about 2 weeks (around 03/11/2018) for rob.   Aletha Halim, MD

## 2018-03-08 ENCOUNTER — Encounter: Payer: Self-pay | Admitting: Obstetrics & Gynecology

## 2018-03-25 ENCOUNTER — Encounter (INDEPENDENT_AMBULATORY_CARE_PROVIDER_SITE_OTHER): Payer: Self-pay | Admitting: *Deleted

## 2018-03-25 ENCOUNTER — Ambulatory Visit (INDEPENDENT_AMBULATORY_CARE_PROVIDER_SITE_OTHER): Payer: 59 | Admitting: Family Medicine

## 2018-03-25 VITALS — BP 105/70 | HR 70 | Wt 194.0 lb

## 2018-03-25 DIAGNOSIS — Z113 Encounter for screening for infections with a predominantly sexual mode of transmission: Secondary | ICD-10-CM | POA: Diagnosis not present

## 2018-03-25 DIAGNOSIS — O35EXX0 Maternal care for other (suspected) fetal abnormality and damage, fetal genitourinary anomalies, not applicable or unspecified: Secondary | ICD-10-CM

## 2018-03-25 DIAGNOSIS — Z3689 Encounter for other specified antenatal screening: Secondary | ICD-10-CM

## 2018-03-25 DIAGNOSIS — O358XX0 Maternal care for other (suspected) fetal abnormality and damage, not applicable or unspecified: Secondary | ICD-10-CM

## 2018-03-25 DIAGNOSIS — Z3483 Encounter for supervision of other normal pregnancy, third trimester: Secondary | ICD-10-CM

## 2018-03-25 LAB — OB RESULTS CONSOLE GBS: STREP GROUP B AG: NEGATIVE

## 2018-03-25 LAB — OB RESULTS CONSOLE GC/CHLAMYDIA: GC PROBE AMP, GENITAL: NEGATIVE

## 2018-03-25 NOTE — Progress Notes (Signed)
   PRENATAL VISIT NOTE  Subjective:  Carol [name] is a 32 y.o. G3P1011 at [redacted]w[redacted]d being seen today for ongoing prenatal care.  She is currently monitored for the following issues for this low-risk pregnancy and has Migraines; Chronic headache disorder; Cervicocranial syndrome; Musculoskeletal disorder of neck; Osteoarthritis; Rh negative state in antepartum period; Supervision of normal pregnancy; Pyelectasis of fetus on prenatal ultrasound; Vaginal discharge during pregnancy in third trimester; and Heart burn on their problem list.  Patient reports no complaints.  Contractions: Not present.  .  Movement: Present. Denies leaking of fluid.   The following portions of the patient's history were reviewed and updated as appropriate: allergies, current medications, past family history, past medical history, past social history, past surgical history and problem list. Problem list updated.  Objective:   Vitals:   03/25/18 0851  BP: 105/70  Pulse: 70  Weight: 194 lb (88 kg)    Fetal Status: Fetal Heart Rate (bpm): 141 Fundal Height: 34 cm Movement: Present  Presentation: Vertex  General:  Alert, oriented and cooperative. Patient is in no acute distress.  Skin: Skin is warm and dry. No rash noted.   Cardiovascular: Normal heart rate noted  Respiratory: Normal respiratory effort, no problems with respiration noted  Abdomen: Soft, gravid, appropriate for gestational age.  Pain/Pressure: Present     Pelvic: Cervical exam performed Dilation: 1 Effacement (%): 50 Station: -3  Extremities: Normal range of motion.     Mental Status: Normal mood and affect. Normal behavior. Normal judgment and thought content.   Assessment and Plan:  Pregnancy: G3P1011 at [redacted]w[redacted]d  1. Encounter for supervision of other normal pregnancy in third trimester Cultures today Last delivery involved 4.5 hours of pushing and FAVD. That baby was 8 lb 10 oz. Husband is 6 foot 5 in, this baby feels bigger, at last [place] EFW at  90%. Desires IOL at 77 wks--to be scheduled at next visit. - GC/Chlamydia probe amp ([place])not at [facility] - Culture, beta strep (group b only)  2. Pyelectasis of fetus on prenatal ultrasound Will need postnatal f/u  Preterm labor symptoms and general obstetric precautions including but not limited to vaginal bleeding, contractions, leaking of fluid and fetal movement were reviewed in detail with the patient. Please refer to After Visit Summary for other counseling recommendations.  Return in 2 weeks (on 04/08/2018).  Future Appointments  Date Time Provider [place]  04/06/2018  3:45 PM Emily Filbert, MD [facility]  04/13/2018  4:30 PM Donnamae Jude, MD [facility]    Donnamae Jude, MD

## 2018-03-25 NOTE — Patient Instructions (Signed)
 Third Trimester of Pregnancy The third trimester is from week 28 through week 40 (months 7 through 9). The third trimester is a time when the unborn baby (fetus) is growing rapidly. At the end of the ninth month, the fetus is about 20 inches in length and weighs 6-10 pounds. Body changes during your third trimester Your body will continue to go through many changes during pregnancy. The changes vary from woman to woman. During the third trimester:  Your weight will continue to increase. You can expect to gain 25-35 pounds (11-16 kg) by the end of the pregnancy.  You may begin to get stretch marks on your hips, abdomen, and breasts.  You may urinate more often because the fetus is moving lower into your pelvis and pressing on your bladder.  You may develop or continue to have heartburn. This is caused by increased hormones that slow down muscles in the digestive tract.  You may develop or continue to have constipation because increased hormones slow digestion and cause the muscles that push waste through your intestines to relax.  You may develop hemorrhoids. These are swollen veins (varicose veins) in the rectum that can itch or be painful.  You may develop swollen, bulging veins (varicose veins) in your legs.  You may have increased body aches in the pelvis, back, or thighs. This is due to weight gain and increased hormones that are relaxing your joints.  You may have changes in your hair. These can include thickening of your hair, rapid growth, and changes in texture. Some women also have hair loss during or after pregnancy, or hair that feels dry or thin. Your hair will most likely return to normal after your baby is born.  Your breasts will continue to grow and they will continue to become tender. A yellow fluid (colostrum) may leak from your breasts. This is the first milk you are producing for your baby.  Your belly button may stick out.  You may notice more swelling in your  hands, face, or ankles.  You may have increased tingling or numbness in your hands, arms, and legs. The skin on your belly may also feel numb.  You may feel short of breath because of your expanding uterus.  You may have more problems sleeping. This can be caused by the size of your belly, increased need to urinate, and an increase in your body's metabolism.  You may notice the fetus "dropping," or moving lower in your abdomen (lightening).  You may have increased vaginal discharge.  You may notice your joints feel loose and you may have pain around your pelvic bone.  What to expect at prenatal visits You will have prenatal exams every 2 weeks until week 36. Then you will have weekly prenatal exams. During a routine prenatal visit:  You will be weighed to make sure you and the baby are growing normally.  Your blood pressure will be taken.  Your abdomen will be measured to track your baby's growth.  The fetal heartbeat will be listened to.  Any test results from the previous visit will be discussed.  You may have a cervical check near your due date to see if your cervix has softened or thinned (effaced).  You will be tested for Group B streptococcus. This happens between 35 and 37 weeks.  Your health care provider may ask you:  What your birth plan is.  How you are feeling.  If you are feeling the baby move.  If you have   had any abnormal symptoms, such as leaking fluid, bleeding, severe headaches, or abdominal cramping.  If you are using any tobacco products, including cigarettes, chewing tobacco, and electronic cigarettes.  If you have any questions.  Other tests or screenings that may be performed during your third trimester include:  Blood tests that check for low iron levels (anemia).  Fetal testing to check the health, activity level, and growth of the fetus. Testing is done if you have certain medical conditions or if there are problems during the  pregnancy.  Nonstress test (NST). This test checks the health of your baby to make sure there are no signs of problems, such as the baby not getting enough oxygen. During this test, a belt is placed around your belly. The baby is made to move, and its heart rate is monitored during movement.  What is false labor? False labor is a condition in which you feel small, irregular tightenings of the muscles in the womb (contractions) that usually go away with rest, changing position, or drinking water. These are called Braxton Hicks contractions. Contractions may last for hours, days, or even weeks before true labor sets in. If contractions come at regular intervals, become more frequent, increase in intensity, or become painful, you should see your health care provider. What are the signs of labor?  Abdominal cramps.  Regular contractions that start at 10 minutes apart and become stronger and more frequent with time.  Contractions that start on the top of the uterus and spread down to the lower abdomen and back.  Increased pelvic pressure and dull back pain.  A watery or bloody mucus discharge that comes from the vagina.  Leaking of amniotic fluid. This is also known as your "water breaking." It could be a slow trickle or a gush. Let your health care provider know if it has a color or strange odor. If you have any of these signs, call your health care provider right away, even if it is before your due date. Follow these instructions at home: Medicines  Follow your health care provider's instructions regarding medicine use. Specific medicines may be either safe or unsafe to take during pregnancy.  Take a prenatal vitamin that contains at least 600 micrograms (mcg) of folic acid.  If you develop constipation, try taking a stool softener if your health care provider approves. Eating and drinking  Eat a balanced diet that includes fresh fruits and vegetables, whole grains, good sources of protein  such as meat, eggs, or tofu, and low-fat dairy. Your health care provider will help you determine the amount of weight gain that is right for you.  Avoid raw meat and uncooked cheese. These carry germs that can cause birth defects in the baby.  If you have low calcium intake from food, talk to your health care provider about whether you should take a daily calcium supplement.  Eat four or five small meals rather than three large meals a day.  Limit foods that are high in fat and processed sugars, such as fried and sweet foods.  To prevent constipation: ? Drink enough fluid to keep your urine clear or pale yellow. ? Eat foods that are high in fiber, such as fresh fruits and vegetables, whole grains, and beans. Activity  Exercise only as directed by your health care provider. Most women can continue their usual exercise routine during pregnancy. Try to exercise for 30 minutes at least 5 days a week. Stop exercising if you experience uterine contractions.  Avoid   heavy lifting.  Do not exercise in extreme heat or humidity, or at high altitudes.  Wear low-heel, comfortable shoes.  Practice good posture.  You may continue to have sex unless your health care provider tells you otherwise. Relieving pain and discomfort  Take frequent breaks and rest with your legs elevated if you have leg cramps or low back pain.  Take warm sitz baths to soothe any pain or discomfort caused by hemorrhoids. Use hemorrhoid cream if your health care provider approves.  Wear a good support bra to prevent discomfort from breast tenderness.  If you develop varicose veins: ? Wear support pantyhose or compression stockings as told by your healthcare provider. ? Elevate your feet for 15 minutes, 3-4 times a day. Prenatal care  Write down your questions. Take them to your prenatal visits.  Keep all your prenatal visits as told by your health care provider. This is important. Safety  Wear your seat belt at  all times when driving.  Make a list of emergency phone numbers, including numbers for family, friends, the hospital, and police and fire departments. General instructions  Avoid cat litter boxes and soil used by cats. These carry germs that can cause birth defects in the baby. If you have a cat, ask someone to clean the litter box for you.  Do not travel far distances unless it is absolutely necessary and only with the approval of your health care provider.  Do not use hot tubs, steam rooms, or saunas.  Do not drink alcohol.  Do not use any products that contain nicotine or tobacco, such as cigarettes and e-cigarettes. If you need help quitting, ask your health care provider.  Do not use any medicinal herbs or unprescribed drugs. These chemicals affect the formation and growth of the baby.  Do not douche or use tampons or scented sanitary pads.  Do not cross your legs for long periods of time.  To prepare for the arrival of your baby: ? Take prenatal classes to understand, practice, and ask questions about labor and delivery. ? Make a trial run to the hospital. ? Visit the hospital and tour the maternity area. ? Arrange for maternity or paternity leave through employers. ? Arrange for family and friends to take care of pets while you are in the hospital. ? Purchase a rear-facing car seat and make sure you know how to install it in your car. ? Pack your hospital bag. ? Prepare the baby's nursery. Make sure to remove all pillows and stuffed animals from the baby's crib to prevent suffocation.  Visit your dentist if you have not gone during your pregnancy. Use a soft toothbrush to brush your teeth and be gentle when you floss. Contact a health care provider if:  You are unsure if you are in labor or if your water has broken.  You become dizzy.  You have mild pelvic cramps, pelvic pressure, or nagging pain in your abdominal area.  You have lower back pain.  You have persistent  nausea, vomiting, or diarrhea.  You have an unusual or bad smelling vaginal discharge.  You have pain when you urinate. Get help right away if:  Your water breaks before 37 weeks.  You have regular contractions less than 5 minutes apart before 37 weeks.  You have a fever.  You are leaking fluid from your vagina.  You have spotting or bleeding from your vagina.  You have severe abdominal pain or cramping.  You have rapid weight loss or weight   gain.  You have shortness of breath with chest pain.  You notice sudden or extreme swelling of your face, hands, ankles, feet, or legs.  Your baby makes fewer than 10 movements in 2 hours.  You have severe headaches that do not go away when you take medicine.  You have vision changes. Summary  The third trimester is from week 28 through week 40, months 7 through 9. The third trimester is a time when the unborn baby (fetus) is growing rapidly.  During the third trimester, your discomfort may increase as you and your baby continue to gain weight. You may have abdominal, leg, and back pain, sleeping problems, and an increased need to urinate.  During the third trimester your breasts will keep growing and they will continue to become tender. A yellow fluid (colostrum) may leak from your breasts. This is the first milk you are producing for your baby.  False labor is a condition in which you feel small, irregular tightenings of the muscles in the womb (contractions) that eventually go away. These are called Braxton Hicks contractions. Contractions may last for hours, days, or even weeks before true labor sets in.  Signs of labor can include: abdominal cramps; regular contractions that start at 10 minutes apart and become stronger and more frequent with time; watery or bloody mucus discharge that comes from the vagina; increased pelvic pressure and dull back pain; and leaking of amniotic fluid. This information is not intended to replace advice  given to you by your health care provider. Make sure you discuss any questions you have with your health care provider. Document Released: 11/18/2001 Document Revised: 05/01/2016 Document Reviewed: 01/25/2013 Elsevier Interactive Patient Education  2017 [place].   Breastfeeding Choosing to breastfeed is one of the best decisions you can make for yourself and your baby. A change in hormones during pregnancy causes your breasts to make breast milk in your milk-producing glands. Hormones prevent breast milk from being released before your baby is born. They also prompt milk flow after birth. Once breastfeeding has begun, thoughts of your baby, as well as his or her sucking or crying, can stimulate the release of milk from your milk-producing glands. Benefits of breastfeeding Research shows that breastfeeding offers many health benefits for infants and mothers. It also offers a cost-free and convenient way to feed your baby. For your baby  Your first milk (colostrum) helps your baby's digestive system to function better.  Special cells in your milk (antibodies) help your baby to fight off infections.  Breastfed babies are less likely to develop asthma, allergies, obesity, or type 2 diabetes. They are also at lower risk for sudden infant death syndrome (SIDS).  Nutrients in breast milk are better able to meet your baby's needs compared to infant formula.  Breast milk improves your baby's brain development. For you  Breastfeeding helps to create a very special bond between you and your baby.  Breastfeeding is convenient. Breast milk costs nothing and is always available at the correct temperature.  Breastfeeding helps to burn calories. It helps you to lose the weight that you gained during pregnancy.  Breastfeeding makes your uterus return faster to its size before pregnancy. It also slows bleeding (lochia) after you give birth.  Breastfeeding helps to lower your risk of developing type  2 diabetes, osteoporosis, rheumatoid arthritis, cardiovascular disease, and breast, ovarian, uterine, and endometrial cancer later in life. Breastfeeding basics Starting breastfeeding  Find a comfortable place to sit or lie down, with your  neck and back well-supported.  Place a pillow or a rolled-up blanket under your baby to bring him or her to the level of your breast (if you are seated). Nursing pillows are specially designed to help support your arms and your baby while you breastfeed.  Make sure that your baby's tummy (abdomen) is facing your abdomen.  Gently massage your breast. With your fingertips, massage from the outer edges of your breast inward toward the nipple. This encourages milk flow. If your milk flows slowly, you may need to continue this action during the feeding.  Support your breast with 4 fingers underneath and your thumb above your nipple (make the letter "C" with your hand). Make sure your fingers are well away from your nipple and your baby's mouth.  Stroke your baby's lips gently with your finger or nipple.  When your baby's mouth is open wide enough, quickly bring your baby to your breast, placing your entire nipple and as much of the areola as possible into your baby's mouth. The areola is the colored area around your nipple. ? More areola should be visible above your baby's upper lip than below the lower lip. ? Your baby's lips should be opened and extended outward (flanged) to ensure an adequate, comfortable latch. ? Your baby's tongue should be between his or her lower gum and your breast.  Make sure that your baby's mouth is correctly positioned around your nipple (latched). Your baby's lips should create a seal on your breast and be turned out (everted).  It is common for your baby to suck about 2-3 minutes in order to start the flow of breast milk. Latching Teaching your baby how to latch onto your breast properly is very important. An improper latch can  cause nipple pain, decreased milk supply, and poor weight gain in your baby. Also, if your baby is not latched onto your nipple properly, he or she may swallow some air during feeding. This can make your baby fussy. Burping your baby when you switch breasts during the feeding can help to get rid of the air. However, teaching your baby to latch on properly is still the best way to prevent fussiness from swallowing air while breastfeeding. Signs that your baby has successfully latched onto your nipple  Silent tugging or silent sucking, without causing you pain. Infant's lips should be extended outward (flanged).  Swallowing heard between every 3-4 sucks once your milk has started to flow (after your let-down milk reflex occurs).  Muscle movement above and in front of his or her ears while sucking.  Signs that your baby has not successfully latched onto your nipple  Sucking sounds or smacking sounds from your baby while breastfeeding.  Nipple pain.  If you think your baby has not latched on correctly, slip your finger into the corner of your baby's mouth to break the suction and place it between your baby's gums. Attempt to start breastfeeding again. Signs of successful breastfeeding Signs from your baby  Your baby will gradually decrease the number of sucks or will completely stop sucking.  Your baby will fall asleep.  Your baby's body will relax.  Your baby will retain a small amount of milk in his or her mouth.  Your baby will let go of your breast by himself or herself.  Signs from you  Breasts that have increased in firmness, weight, and size 1-3 hours after feeding.  Breasts that are softer immediately after breastfeeding.  Increased milk volume, as well as a  change in milk consistency and color by the fifth day of breastfeeding.  Nipples that are not sore, cracked, or bleeding.  Signs that your baby is getting enough milk  Wetting at least 1-2 diapers during the first 24  hours after birth.  Wetting at least 5-6 diapers every 24 hours for the first week after birth. The urine should be clear or pale yellow by the age of 5 days.  Wetting 6-8 diapers every 24 hours as your baby continues to grow and develop.  At least 3 stools in a 24-hour period by the age of 5 days. The stool should be soft and yellow.  At least 3 stools in a 24-hour period by the age of 7 days. The stool should be seedy and yellow.  No loss of weight greater than 10% of birth weight during the first 3 days of life.  Average weight gain of 4-7 oz (113-198 g) per week after the age of 4 days.  Consistent daily weight gain by the age of 5 days, without weight loss after the age of 2 weeks. After a feeding, your baby may spit up a small amount of milk. This is normal. Breastfeeding frequency and duration Frequent feeding will help you make more milk and can prevent sore nipples and extremely full breasts (breast engorgement). Breastfeed when you feel the need to reduce the fullness of your breasts or when your baby shows signs of hunger. This is called "breastfeeding on demand." Signs that your baby is hungry include:  Increased alertness, activity, or restlessness.  Movement of the head from side to side.  Opening of the mouth when the corner of the mouth or cheek is stroked (rooting).  Increased sucking sounds, smacking lips, cooing, sighing, or squeaking.  Hand-to-mouth movements and sucking on fingers or hands.  Fussing or crying.  Avoid introducing a pacifier to your baby in the first 4-6 weeks after your baby is born. After this time, you may choose to use a pacifier. Research has shown that pacifier use during the first year of a baby's life decreases the risk of sudden infant death syndrome (SIDS). Allow your baby to feed on each breast as long as he or she wants. When your baby unlatches or falls asleep while feeding from the first breast, offer the second breast. Because  newborns are often sleepy in the first few weeks of life, you may need to awaken your baby to get him or her to feed. Breastfeeding times will vary from baby to baby. However, the following rules can serve as a guide to help you make sure that your baby is properly fed:  Newborns (babies 70 weeks of age or younger) may breastfeed every 1-3 hours.  Newborns should not go without breastfeeding for longer than 3 hours during the day or 5 hours during the night.  You should breastfeed your baby a minimum of 8 times in a 24-hour period.  Breast milk pumping Pumping and storing breast milk allows you to make sure that your baby is exclusively fed your breast milk, even at times when you are unable to breastfeed. This is especially important if you go back to work while you are still breastfeeding, or if you are not able to be present during feedings. Your lactation consultant can help you find a method of pumping that works best for you and give you guidelines about how long it is safe to store breast milk. Caring for your breasts while you breastfeed Nipples can become  dry, cracked, and sore while breastfeeding. The following recommendations can help keep your breasts moisturized and healthy:  Avoid using soap on your nipples.  Wear a supportive bra designed especially for nursing. Avoid wearing underwire-style bras or extremely tight bras (sports bras).  Air-dry your nipples for 3-4 minutes after each feeding.  Use only cotton bra pads to absorb leaked breast milk. Leaking of breast milk between feedings is normal.  Use lanolin on your nipples after breastfeeding. Lanolin helps to maintain your skin's normal moisture barrier. Pure lanolin is not harmful (not toxic) to your baby. You may also hand express a few drops of breast milk and gently massage that milk into your nipples and allow the milk to air-dry.  In the first few weeks after giving birth, some women experience breast engorgement.  Engorgement can make your breasts feel heavy, warm, and tender to the touch. Engorgement peaks within 3-5 days after you give birth. The following recommendations can help to ease engorgement:  Completely empty your breasts while breastfeeding or pumping. You may want to start by applying warm, moist heat (in the shower or with warm, water-soaked hand towels) just before feeding or pumping. This increases circulation and helps the milk flow. If your baby does not completely empty your breasts while breastfeeding, pump any extra milk after he or she is finished.  Apply ice packs to your breasts immediately after breastfeeding or pumping, unless this is too uncomfortable for you. To do this: ? Put ice in a plastic bag. ? Place a towel between your skin and the bag. ? Leave the ice on for 20 minutes, 2-3 times a day.  Make sure that your baby is latched on and positioned properly while breastfeeding.  If engorgement persists after 48 hours of following these recommendations, contact your health care provider or a Science writer. Overall health care recommendations while breastfeeding  Eat 3 healthy meals and 3 snacks every day. Well-nourished mothers who are breastfeeding need an additional 450-500 calories a day. You can meet this requirement by increasing the amount of a balanced diet that you eat.  Drink enough water to keep your urine pale yellow or clear.  Rest often, relax, and continue to take your prenatal vitamins to prevent fatigue, stress, and low vitamin and mineral levels in your body (nutrient deficiencies).  Do not use any products that contain nicotine or tobacco, such as cigarettes and e-cigarettes. Your baby may be harmed by chemicals from cigarettes that pass into breast milk and exposure to secondhand smoke. If you need help quitting, ask your health care provider.  Avoid alcohol.  Do not use illegal drugs or marijuana.  Talk with your health care provider before  taking any medicines. These include over-the-counter and prescription medicines as well as vitamins and herbal supplements. Some medicines that may be harmful to your baby can pass through breast milk.  It is possible to become pregnant while breastfeeding. If birth control is desired, ask your health care provider about options that will be safe while breastfeeding your baby. Where to find more information: [employer]: www.llli.org Contact a health care provider if:  You feel like you want to stop breastfeeding or have become frustrated with breastfeeding.  Your nipples are cracked or bleeding.  Your breasts are red, tender, or warm.  You have: ? Painful breasts or nipples. ? A swollen area on either breast. ? A fever or chills. ? Nausea or vomiting. ? Drainage other than breast milk from your  breasts do not become full before feedings by the fifth day after you give birth.  You feel sad and depressed.  Your baby is: ? Too sleepy to eat well. ? Having trouble sleeping. ? More than 1 week old and wetting fewer than 6 diapers in a 24-hour period. ? Not gaining weight by 5 days of age.  Your baby has fewer than 3 stools in a 24-hour period.  Your baby's skin or the white parts of his or her eyes become yellow. Get help right away if:  Your baby is overly tired (lethargic) and does not want to wake up and feed.  Your baby develops an unexplained fever. Summary  Breastfeeding offers many health benefits for infant and mothers.  Try to breastfeed your infant when he or she shows early signs of hunger.  Gently tickle or stroke your baby's lips with your finger or nipple to allow the baby to open his or her mouth. Bring the baby to your breast. Make sure that much of the areola is in your baby's mouth. Offer one side and burp the baby before you offer the other side.  Talk with your health care provider or lactation consultant if you have  questions or you face problems as you breastfeed. This information is not intended to replace advice given to you by your health care provider. Make sure you discuss any questions you have with your health care provider. Document Released: 11/24/2005 Document Revised: 12/26/2016 Document Reviewed: 12/26/2016 Elsevier Interactive Patient Education  2018 Elsevier Inc.  

## 2018-03-26 LAB — GC/CHLAMYDIA PROBE AMP (~~LOC~~) NOT AT ARMC
Chlamydia: NEGATIVE
NEISSERIA GONORRHEA: NEGATIVE

## 2018-03-29 ENCOUNTER — Encounter: Payer: Self-pay | Admitting: Obstetrics & Gynecology

## 2018-03-29 LAB — CULTURE, BETA STREP (GROUP B ONLY): Strep Gp B Culture: NEGATIVE

## 2018-03-30 ENCOUNTER — Ambulatory Visit (INDEPENDENT_AMBULATORY_CARE_PROVIDER_SITE_OTHER): Payer: 59 | Admitting: Family Medicine

## 2018-03-30 VITALS — BP 114/71 | HR 90 | Wt 196.4 lb

## 2018-03-30 DIAGNOSIS — Z3483 Encounter for supervision of other normal pregnancy, third trimester: Secondary | ICD-10-CM

## 2018-03-30 DIAGNOSIS — O368130 Decreased fetal movements, third trimester, not applicable or unspecified: Secondary | ICD-10-CM | POA: Diagnosis not present

## 2018-03-30 DIAGNOSIS — Z3480 Encounter for supervision of other normal pregnancy, unspecified trimester: Secondary | ICD-10-CM

## 2018-03-30 NOTE — Progress Notes (Signed)
   PRENATAL VISIT NOTE  Subjective:  Carol [name] is a 32 y.o. G3P1011 at [redacted]w[redacted]d being seen today for ongoing prenatal care.  She is currently monitored for the following issues for this low-risk pregnancy and has Migraines; Chronic headache disorder; Cervicocranial syndrome; Musculoskeletal disorder of neck; Osteoarthritis; Rh negative state in antepartum period; Supervision of normal pregnancy; Pyelectasis of fetus on prenatal ultrasound; Vaginal discharge during pregnancy in third trimester; and Heart burn on their problem list.  Patient reports decreased fetal movement.  Contractions: Irregular. Vag. Bleeding: None.  Movement: (!) Decreased. Denies leaking of fluid.   The following portions of the patient's history were reviewed and updated as appropriate: allergies, current medications, past family history, past medical history, past social history, past surgical history and problem list. Problem list updated.  Objective:   Vitals:   03/30/18 0844  BP: 114/71  Pulse: 90  Weight: 196 lb 6.4 oz (89.1 kg)    Fetal Status: Fetal Heart Rate (bpm): 145 Fundal Height: 34 cm Movement: (!) Decreased  Presentation: Vertex  General:  Alert, oriented and cooperative. Patient is in no acute distress.  Skin: Skin is warm and dry. No rash noted.   Cardiovascular: Normal heart rate noted  Respiratory: Normal respiratory effort, no problems with respiration noted  Abdomen: Soft, gravid, appropriate for gestational age.  Pain/Pressure: Present     Pelvic: Cervical exam deferred        Extremities: Normal range of motion.  Edema: Trace  Mental Status: Normal mood and affect. Normal behavior. Normal judgment and thought content.  NST:  Baseline: 135 bpm, Variability: Good {> 6 bpm), Accelerations: Reactive and Decelerations: Absent   Assessment and Plan:  Pregnancy: G3P1011 at [redacted]w[redacted]d  1. Supervision of other normal pregnancy, antepartum Continue routine prenatal care.  2. Decreased  FM NST is reactive  Term labor symptoms and general obstetric precautions including but not limited to vaginal bleeding, contractions, leaking of fluid and fetal movement were reviewed in detail with the patient. Please refer to After Visit Summary for other counseling recommendations.  Return in 1 week (on 04/06/2018).  Future Appointments  Date Time Provider [place]  04/06/2018  3:45 PM Emily Filbert, MD [facility]  04/13/2018  4:30 PM Donnamae Jude, MD [facility]    Donnamae Jude, MD

## 2018-03-30 NOTE — Patient Instructions (Signed)

## 2018-04-06 ENCOUNTER — Ambulatory Visit (INDEPENDENT_AMBULATORY_CARE_PROVIDER_SITE_OTHER): Payer: 59 | Admitting: Obstetrics & Gynecology

## 2018-04-06 ENCOUNTER — Encounter: Payer: 59 | Admitting: Obstetrics & Gynecology

## 2018-04-06 DIAGNOSIS — Z3480 Encounter for supervision of other normal pregnancy, unspecified trimester: Secondary | ICD-10-CM

## 2018-04-06 DIAGNOSIS — O368130 Decreased fetal movements, third trimester, not applicable or unspecified: Secondary | ICD-10-CM

## 2018-04-06 DIAGNOSIS — Z3483 Encounter for supervision of other normal pregnancy, third trimester: Secondary | ICD-10-CM

## 2018-04-06 NOTE — Progress Notes (Signed)
   PRENATAL VISIT NOTE  Subjective:  Carol [name] is a 32 y.o. G3P1011 at [redacted]w[redacted]d being seen today for ongoing prenatal care.  She is currently monitored for the following issues for this low-risk pregnancy and has Migraines; Chronic headache disorder; Cervicocranial syndrome; Musculoskeletal disorder of neck; Osteoarthritis; Rh negative state in antepartum period; Supervision of normal pregnancy; Pyelectasis of fetus on prenatal ultrasound; Vaginal discharge during pregnancy in third trimester; and Heart burn on their problem list.  Patient reports no complaints.  Contractions: Irregular. Vag. Bleeding: None.  Movement: (!) Decreased. Denies leaking of fluid.   The following portions of the patient's history were reviewed and updated as appropriate: allergies, current medications, past family history, past medical history, past social history, past surgical history and problem list. Problem list updated.  Objective:   Vitals:   04/06/18 1543  BP: 116/75  Pulse: 84  Weight: 198 lb (89.8 kg)    Fetal Status: Fetal Heart Rate (bpm): 130   Movement: (!) Decreased     General:  Alert, oriented and cooperative. Patient is in no acute distress.  Skin: Skin is warm and dry. No rash noted.   Cardiovascular: Normal heart rate noted  Respiratory: Normal respiratory effort, no problems with respiration noted  Abdomen: Soft, gravid, appropriate for gestational age.  Pain/Pressure: Present     Pelvic: Cervical exam performed        Extremities: Normal range of motion.  Edema: Mild pitting, slight indentation  Mental Status: Normal mood and affect. Normal behavior. Normal judgment and thought content.   Assessment and Plan:  Pregnancy: G3P1011 at [redacted]w[redacted]d  1. Macrosomia (rule out)  - [place] MFM OB FOLLOW UP; Future  2. Supervision of other normal pregnancy, antepartum   3. Decreased fetal movements in third trimester, single or unspecified fetus   Term labor symptoms and general obstetric  precautions including but not limited to vaginal bleeding, contractions, leaking of fluid and fetal movement were reviewed in detail with the patient. Please refer to After Visit Summary for other counseling recommendations.  Return in about 1 week (around 04/13/2018).  Future Appointments  Date Time Provider [place]  04/13/2018  4:30 PM Donnamae Jude, MD [facility]    Emily Filbert, MD

## 2018-04-07 ENCOUNTER — Other Ambulatory Visit: Payer: Self-pay | Admitting: Student

## 2018-04-08 ENCOUNTER — Other Ambulatory Visit: Payer: Self-pay | Admitting: Obstetrics & Gynecology

## 2018-04-08 ENCOUNTER — Encounter: Payer: Self-pay | Admitting: Family Medicine

## 2018-04-08 ENCOUNTER — Ambulatory Visit (HOSPITAL_COMMUNITY)
Admission: RE | Admit: 2018-04-08 | Discharge: 2018-04-08 | Disposition: A | Discharge status: Home / Self Care | Payer: 59 | Source: Ambulatory Visit | Attending: Obstetrics & Gynecology | Admitting: Obstetrics & Gynecology

## 2018-04-08 ENCOUNTER — Telehealth: Payer: Self-pay

## 2018-04-08 DIAGNOSIS — Z362 Encounter for other antenatal screening follow-up: Secondary | ICD-10-CM | POA: Diagnosis not present

## 2018-04-08 DIAGNOSIS — O359XX0 Maternal care for (suspected) fetal abnormality and damage, unspecified, not applicable or unspecified: Secondary | ICD-10-CM

## 2018-04-08 DIAGNOSIS — O26843 Uterine size-date discrepancy, third trimester: Secondary | ICD-10-CM

## 2018-04-08 DIAGNOSIS — Z3A38 38 weeks gestation of pregnancy: Secondary | ICD-10-CM

## 2018-04-08 DIAGNOSIS — O358XX0 Maternal care for other (suspected) fetal abnormality and damage, not applicable or unspecified: Secondary | ICD-10-CM | POA: Diagnosis not present

## 2018-04-08 NOTE — Telephone Encounter (Signed)
Received call from patient concerning being sob for the last couple of days. Patient was seen yesterday in the office as well. She thinks the SOB is her anxiety and right now she can not take anything for it. I have advised patient to maybe try some deep breathing and try not to focus so much on her anxiety at that moment. Patient also c/o back pain and wanted to make sure this was normal part of pregnancy. I have discuss patient that it is normal and maybe try and take some tylenol and get rest. I explained to the patient she can go to MAU at [facility] if her symptoms get worse this afternoon.

## 2018-04-10 ENCOUNTER — Other Ambulatory Visit: Payer: Self-pay

## 2018-04-10 ENCOUNTER — Encounter (HOSPITAL_COMMUNITY): Payer: Self-pay | Admitting: *Deleted

## 2018-04-10 ENCOUNTER — Inpatient Hospital Stay (HOSPITAL_COMMUNITY)
Admission: AD | Admit: 2018-04-10 | Discharge: 2018-04-10 | Disposition: A | Discharge status: Home / Self Care | Payer: 59 | Source: Ambulatory Visit | Attending: Obstetrics and Gynecology | Admitting: Obstetrics and Gynecology

## 2018-04-10 DIAGNOSIS — Z3480 Encounter for supervision of other normal pregnancy, unspecified trimester: Secondary | ICD-10-CM

## 2018-04-10 DIAGNOSIS — O479 False labor, unspecified: Secondary | ICD-10-CM

## 2018-04-10 DIAGNOSIS — O26899 Other specified pregnancy related conditions, unspecified trimester: Secondary | ICD-10-CM

## 2018-04-10 DIAGNOSIS — O35EXX0 Maternal care for other (suspected) fetal abnormality and damage, fetal genitourinary anomalies, not applicable or unspecified: Secondary | ICD-10-CM

## 2018-04-10 DIAGNOSIS — Z3A39 39 weeks gestation of pregnancy: Secondary | ICD-10-CM | POA: Diagnosis not present

## 2018-04-10 DIAGNOSIS — Z3483 Encounter for supervision of other normal pregnancy, third trimester: Secondary | ICD-10-CM | POA: Insufficient documentation

## 2018-04-10 DIAGNOSIS — Z6791 Unspecified blood type, Rh negative: Secondary | ICD-10-CM

## 2018-04-10 DIAGNOSIS — O358XX0 Maternal care for other (suspected) fetal abnormality and damage, not applicable or unspecified: Secondary | ICD-10-CM

## 2018-04-10 NOTE — MAU Note (Signed)
Membranes stripped on Tues was 1+.  Growth [place] on Thursday, 9+#.  Lost plug.  Last night started having bad menstrual cramps. Cramps continue

## 2018-04-10 NOTE — Progress Notes (Signed)
I have communicated with Dr Lambert Keto and reviewed vital signs:  Vitals:   04/10/18 1307  BP: 127/75  Pulse: 85  Resp: 17  Temp: 98.4 F (36.9 C)    Vaginal exam:  Dilation: 2 Effacement (%): 60 Cervical Position: Posterior Station: -3 Exam by:: Jacquel Redditt,   Also reviewed contraction pattern and that non-stress test is reactive.  It has been documented that patient is contracting every 2-4 minutes with minimal cervical change since membranes stripped on Tuesday,  not indicating active labor.  Patient denies any other complaints.  Based on this report provider has given order for discharge.  A discharge order and diagnosis entered by a provider.   Labor discharge instructions reviewed with patient.

## 2018-04-10 NOTE — Discharge Instructions (Signed)
Braxton Hicks Contractions °Contractions of the uterus can occur throughout pregnancy, but they are not always a sign that you are in labor. You may have practice contractions called Braxton Hicks contractions. These false labor contractions are sometimes confused with true labor. °What are Braxton Hicks contractions? °Braxton Hicks contractions are tightening movements that occur in the muscles of the uterus before labor. Unlike true labor contractions, these contractions do not result in opening (dilation) and thinning of the cervix. Toward the end of pregnancy (32-34 weeks), Braxton Hicks contractions can happen more often and may become stronger. These contractions are sometimes difficult to tell apart from true labor because they can be very uncomfortable. You should not feel embarrassed if you go to the hospital with false labor. °Sometimes, the only way to tell if you are in true labor is for your health care provider to look for changes in the cervix. The health care provider will do a physical exam and may monitor your contractions. If you are not in true labor, the exam should show that your cervix is not dilating and your water has not broken. °If there are other health problems associated with your pregnancy, it is completely safe for you to be sent home with false labor. You may continue to have Braxton Hicks contractions until you go into true labor. °How to tell the difference between true labor and false labor °True labor °· Contractions last 30-70 seconds. °· Contractions become very regular. °· Discomfort is usually felt in the top of the uterus, and it spreads to the lower abdomen and low back. °· Contractions do not go away with walking. °· Contractions usually become more intense and increase in frequency. °· The cervix dilates and gets thinner. °False labor °· Contractions are usually shorter and not as strong as true labor contractions. °· Contractions are usually irregular. °· Contractions  are often felt in the front of the lower abdomen and in the groin. °· Contractions may go away when you walk around or change positions while lying down. °· Contractions get weaker and are shorter-lasting as time goes on. °· The cervix usually does not dilate or become thin. °Follow these instructions at home: °· Take over-the-counter and prescription medicines only as told by your health care provider. °· Keep up with your usual exercises and follow other instructions from your health care provider. °· Eat and drink lightly if you think you are going into labor. °· If Braxton Hicks contractions are making you uncomfortable: °? Change your position from lying down or resting to walking, or change from walking to resting. °? Sit and rest in a tub of warm water. °? Drink enough fluid to keep your urine pale yellow. Dehydration may cause these contractions. °? Do slow and deep breathing several times an hour. °· Keep all follow-up prenatal visits as told by your health care provider. This is important. °Contact a health care provider if: °· You have a fever. °· You have continuous pain in your abdomen. °Get help right away if: °· Your contractions become stronger, more regular, and closer together. °· You have fluid leaking or gushing from your vagina. °· You pass blood-tinged mucus (bloody show). °· You have bleeding from your vagina. °· You have low back pain that you never had before. °· You feel your baby’s head pushing down and causing pelvic pressure. °· Your baby is not moving inside you as much as it used to. °Summary °· Contractions that occur before labor are called Braxton   Hicks contractions, false labor, or practice contractions. °· Braxton Hicks contractions are usually shorter, weaker, farther apart, and less regular than true labor contractions. True labor contractions usually become progressively stronger and regular and they become more frequent. °· Manage discomfort from Braxton Hicks contractions by  changing position, resting in a warm bath, drinking plenty of water, or practicing deep breathing. °This information is not intended to replace advice given to you by your health care provider. Make sure you discuss any questions you have with your health care provider. °Document Released: 04/09/2017 Document Revised: 04/09/2017 Document Reviewed: 04/09/2017 °Elsevier Interactive Patient Education © 2018 Elsevier Inc. ° °

## 2018-04-13 ENCOUNTER — Ambulatory Visit (INDEPENDENT_AMBULATORY_CARE_PROVIDER_SITE_OTHER): Payer: 59 | Admitting: Family Medicine

## 2018-04-13 ENCOUNTER — Encounter: Payer: 59 | Admitting: Family Medicine

## 2018-04-13 VITALS — BP 121/75 | HR 93 | Wt 197.4 lb

## 2018-04-13 DIAGNOSIS — Z3483 Encounter for supervision of other normal pregnancy, third trimester: Secondary | ICD-10-CM

## 2018-04-13 NOTE — Patient Instructions (Signed)

## 2018-04-13 NOTE — Progress Notes (Signed)
   PRENATAL VISIT NOTE  Subjective:  Carol [name] is a 32 y.o. G3P1011 at [redacted]w[redacted]d being seen today for ongoing prenatal care.  She is currently monitored for the following issues for this low-risk pregnancy and has Migraines; Chronic headache disorder; Cervicocranial syndrome; Musculoskeletal disorder of neck; Osteoarthritis; Rh negative state in antepartum period; Supervision of normal pregnancy; Pyelectasis of fetus on prenatal ultrasound; and Heart burn on their problem list.  Patient reports no complaints.  Contractions: Irregular. Vag. Bleeding: None.  Movement: Present. Denies leaking of fluid.   The following portions of the patient's history were reviewed and updated as appropriate: allergies, current medications, past family history, past medical history, past social history, past surgical history and problem list. Problem list updated.  Objective:   Vitals:   04/13/18 1448  BP: 121/75  Pulse: 93  Weight: 197 lb 6.4 oz (89.5 kg)    Fetal Status: Fetal Heart Rate (bpm): 130   Movement: Present  Presentation: Vertex  General:  Alert, oriented and cooperative. Patient is in no acute distress.  Skin: Skin is warm and dry. No rash noted.   Cardiovascular: Normal heart rate noted  Respiratory: Normal respiratory effort, no problems with respiration noted  Abdomen: Soft, gravid, appropriate for gestational age.  Pain/Pressure: Present     Pelvic: Cervical exam performed Dilation: 1.5 Effacement (%): 50 Station: -3  Extremities: Normal range of motion.  Edema: Mild pitting, slight indentation  Mental Status: Normal mood and affect. Normal behavior. Normal judgment and thought content.   Assessment and Plan:  Pregnancy: G3P1011 at [redacted]w[redacted]d  1. Encounter for supervision of other normal pregnancy in third trimester Continue routine prenatal care. Membranes swept For IOL due to LGA and term--scheduled for tomorrow am. Offered in office foley balloon today, but she declined.  Term  labor symptoms and general obstetric precautions including but not limited to vaginal bleeding, contractions, leaking of fluid and fetal movement were reviewed in detail with the patient. Please refer to After Visit Summary for other counseling recommendations.  Return in 1 week (on 04/20/2018).  Future Appointments  Date Time Provider [place]  04/14/2018  7:30 AM [facility] ROOM [facility] None  05/25/2018 10:15 AM Donnamae Jude, MD [facility]    Donnamae Jude, MD

## 2018-04-14 ENCOUNTER — Encounter (HOSPITAL_COMMUNITY): Payer: Self-pay

## 2018-04-14 ENCOUNTER — Inpatient Hospital Stay (HOSPITAL_COMMUNITY): Payer: 59 | Admitting: Anesthesiology

## 2018-04-14 ENCOUNTER — Other Ambulatory Visit: Payer: Self-pay

## 2018-04-14 ENCOUNTER — Inpatient Hospital Stay (HOSPITAL_COMMUNITY)
Admission: RE | Admit: 2018-04-14 | Discharge: 2018-04-16 | DRG: 807 | Disposition: A | Discharge status: Home / Self Care | Payer: 59 | Source: Ambulatory Visit | Attending: Obstetrics and Gynecology | Admitting: Obstetrics and Gynecology

## 2018-04-14 DIAGNOSIS — Z3A39 39 weeks gestation of pregnancy: Secondary | ICD-10-CM

## 2018-04-14 DIAGNOSIS — O3663X0 Maternal care for excessive fetal growth, third trimester, not applicable or unspecified: Principal | ICD-10-CM | POA: Diagnosis present

## 2018-04-14 DIAGNOSIS — O09299 Supervision of pregnancy with other poor reproductive or obstetric history, unspecified trimester: Secondary | ICD-10-CM | POA: Diagnosis present

## 2018-04-14 DIAGNOSIS — O3660X0 Maternal care for excessive fetal growth, unspecified trimester, not applicable or unspecified: Secondary | ICD-10-CM

## 2018-04-14 DIAGNOSIS — Z3A38 38 weeks gestation of pregnancy: Secondary | ICD-10-CM

## 2018-04-14 DIAGNOSIS — Z87891 Personal history of nicotine dependence: Secondary | ICD-10-CM | POA: Diagnosis not present

## 2018-04-14 LAB — CBC
HEMATOCRIT: 32.5 % — AB (ref 36.0–46.0)
Hemoglobin: 11.2 g/dL — ABNORMAL LOW (ref 12.0–15.0)
MCH: 31.1 pg (ref 26.0–34.0)
MCHC: 34.5 g/dL (ref 30.0–36.0)
MCV: 90.3 fL (ref 78.0–100.0)
Platelets: 323 10*3/uL (ref 150–400)
RBC: 3.6 MIL/uL — ABNORMAL LOW (ref 3.87–5.11)
RDW: 14.9 % (ref 11.5–15.5)
WBC: 11.9 10*3/uL — AB (ref 4.0–10.5)

## 2018-04-14 LAB — RPR: RPR Ser Ql: NONREACTIVE

## 2018-04-14 MED ORDER — FENTANYL CITRATE (PF) 100 MCG/2ML IJ SOLN
INTRAMUSCULAR | Status: AC
  Filled 2018-04-14: qty 2

## 2018-04-14 MED ORDER — LIDOCAINE HCL (PF) 1 % IJ SOLN
30.0000 mL | INTRAMUSCULAR | Status: DC | PRN
  Filled 2018-04-14: qty 30

## 2018-04-14 MED ORDER — OXYTOCIN BOLUS FROM INFUSION
500.0000 mL | Freq: Once | INTRAVENOUS | Status: AC
  Administered 2018-04-14: 500 mL via INTRAVENOUS

## 2018-04-14 MED ORDER — BENZOCAINE-MENTHOL 20-0.5 % EX AERO
1.0000 "application " | INHALATION_SPRAY | CUTANEOUS | Status: DC | PRN
  Filled 2018-04-14: qty 56

## 2018-04-14 MED ORDER — DIPHENHYDRAMINE HCL 25 MG PO CAPS: 25.0000 mg | ORAL_CAPSULE | Freq: Four times a day (QID) | ORAL | Status: DC | PRN

## 2018-04-14 MED ORDER — PRENATAL MULTIVITAMIN CH
1.0000 | ORAL_TABLET | Freq: Every day | ORAL | Status: DC
  Administered 2018-04-15: 1 via ORAL
  Filled 2018-04-14 (×2): qty 1

## 2018-04-14 MED ORDER — ACETAMINOPHEN 325 MG PO TABS
650.0000 mg | ORAL_TABLET | ORAL | Status: DC | PRN
  Administered 2018-04-15 (×2): 650 mg via ORAL
  Filled 2018-04-14 (×2): qty 2

## 2018-04-14 MED ORDER — EPHEDRINE 5 MG/ML INJ
10.0000 mg | INTRAVENOUS | Status: DC | PRN
  Filled 2018-04-14: qty 2

## 2018-04-14 MED ORDER — LACTATED RINGERS IV SOLN
INTRAVENOUS | Status: DC
  Administered 2018-04-14: 08:00:00 via INTRAVENOUS

## 2018-04-14 MED ORDER — FENTANYL 2.5 MCG/ML BUPIVACAINE 1/10 % EPIDURAL INFUSION (WH - ANES)
14.0000 mL/h | INTRAMUSCULAR | Status: DC | PRN
  Filled 2018-04-14: qty 100

## 2018-04-14 MED ORDER — IBUPROFEN 600 MG PO TABS
600.0000 mg | ORAL_TABLET | Freq: Four times a day (QID) | ORAL | Status: DC
  Administered 2018-04-15 – 2018-04-16 (×7): 600 mg via ORAL
  Filled 2018-04-14 (×6): qty 1

## 2018-04-14 MED ORDER — TERBUTALINE SULFATE 1 MG/ML IJ SOLN
0.2500 mg | Freq: Once | INTRAMUSCULAR | Status: DC | PRN
  Filled 2018-04-14: qty 1

## 2018-04-14 MED ORDER — MISOPROSTOL 200 MCG PO TABS
ORAL_TABLET | ORAL | Status: AC
  Administered 2018-04-14: 800 ug via BUCCAL
  Filled 2018-04-14: qty 4

## 2018-04-14 MED ORDER — SENNOSIDES-DOCUSATE SODIUM 8.6-50 MG PO TABS
2.0000 | ORAL_TABLET | ORAL | Status: DC
  Administered 2018-04-15 – 2018-04-16 (×2): 2 via ORAL
  Filled 2018-04-14: qty 2

## 2018-04-14 MED ORDER — ZOLPIDEM TARTRATE 5 MG PO TABS: 5.0000 mg | ORAL_TABLET | Freq: Every evening | ORAL | Status: DC | PRN

## 2018-04-14 MED ORDER — PHENYLEPHRINE 40 MCG/ML (10ML) SYRINGE FOR IV PUSH (FOR BLOOD PRESSURE SUPPORT)
80.0000 ug | PREFILLED_SYRINGE | INTRAVENOUS | Status: DC | PRN
  Filled 2018-04-14: qty 5

## 2018-04-14 MED ORDER — OXYTOCIN 40 UNITS IN LACTATED RINGERS INFUSION - SIMPLE MED
2.5000 [IU]/h | INTRAVENOUS | Status: DC
  Filled 2018-04-14: qty 1000

## 2018-04-14 MED ORDER — TETANUS-DIPHTH-ACELL PERTUSSIS 5-2.5-18.5 LF-MCG/0.5 IM SUSP: 0.5000 mL | Freq: Once | INTRAMUSCULAR | Status: DC

## 2018-04-14 MED ORDER — SIMETHICONE 80 MG PO CHEW: 80.0000 mg | CHEWABLE_TABLET | ORAL | Status: DC | PRN

## 2018-04-14 MED ORDER — DIPHENHYDRAMINE HCL 50 MG/ML IJ SOLN: 12.5000 mg | INTRAMUSCULAR | Status: DC | PRN

## 2018-04-14 MED ORDER — OXYCODONE-ACETAMINOPHEN 5-325 MG PO TABS
1.0000 | ORAL_TABLET | ORAL | Status: DC | PRN
  Filled 2018-04-14: qty 1

## 2018-04-14 MED ORDER — WITCH HAZEL-GLYCERIN EX PADS
1.0000 "application " | MEDICATED_PAD | CUTANEOUS | Status: DC | PRN
  Administered 2018-04-15: 1 via TOPICAL

## 2018-04-14 MED ORDER — OXYCODONE-ACETAMINOPHEN 5-325 MG PO TABS
2.0000 | ORAL_TABLET | ORAL | Status: DC | PRN
Start: 2018-04-14 — End: 2018-04-14

## 2018-04-14 MED ORDER — FENTANYL 2.5 MCG/ML BUPIVACAINE 1/10 % EPIDURAL INFUSION (WH - ANES)
14.0000 mL/h | INTRAMUSCULAR | Status: DC | PRN
  Administered 2018-04-14: 14 mL/h via EPIDURAL

## 2018-04-14 MED ORDER — ONDANSETRON HCL 4 MG/2ML IJ SOLN
4.0000 mg | Freq: Four times a day (QID) | INTRAMUSCULAR | Status: DC | PRN
  Administered 2018-04-14: 4 mg via INTRAVENOUS
  Filled 2018-04-14 (×2): qty 2

## 2018-04-14 MED ORDER — OXYTOCIN 40 UNITS IN LACTATED RINGERS INFUSION - SIMPLE MED
1.0000 m[IU]/min | INTRAVENOUS | Status: DC
  Administered 2018-04-14: 2 m[IU]/min via INTRAVENOUS

## 2018-04-14 MED ORDER — LIDOCAINE HCL (PF) 1 % IJ SOLN
INTRAMUSCULAR | Status: DC | PRN
  Administered 2018-04-14 (×2): 4 mL via EPIDURAL

## 2018-04-14 MED ORDER — ONDANSETRON HCL 4 MG/2ML IJ SOLN: 4.0000 mg | INTRAMUSCULAR | Status: DC | PRN

## 2018-04-14 MED ORDER — MISOPROSTOL 200 MCG PO TABS
800.0000 ug | ORAL_TABLET | Freq: Once | ORAL | Status: AC
  Administered 2018-04-14: 800 ug via BUCCAL

## 2018-04-14 MED ORDER — SOD CITRATE-CITRIC ACID 500-334 MG/5ML PO SOLN: 30.0000 mL | ORAL | Status: DC | PRN

## 2018-04-14 MED ORDER — DIBUCAINE 1 % RE OINT: 1.0000 "application " | TOPICAL_OINTMENT | RECTAL | Status: DC | PRN

## 2018-04-14 MED ORDER — ONDANSETRON HCL 4 MG PO TABS: 4.0000 mg | ORAL_TABLET | ORAL | Status: DC | PRN

## 2018-04-14 MED ORDER — COCONUT OIL OIL
1.0000 "application " | TOPICAL_OIL | Status: DC | PRN
  Administered 2018-04-15: 1 via TOPICAL
  Filled 2018-04-14: qty 120

## 2018-04-14 MED ORDER — LACTATED RINGERS IV SOLN: 500.0000 mL | INTRAVENOUS | Status: DC | PRN

## 2018-04-14 MED ORDER — ACETAMINOPHEN 325 MG PO TABS
650.0000 mg | ORAL_TABLET | ORAL | Status: DC | PRN
  Administered 2018-04-14: 650 mg via ORAL
  Filled 2018-04-14: qty 2

## 2018-04-14 MED ORDER — PHENYLEPHRINE 40 MCG/ML (10ML) SYRINGE FOR IV PUSH (FOR BLOOD PRESSURE SUPPORT)
80.0000 ug | PREFILLED_SYRINGE | INTRAVENOUS | Status: DC | PRN
  Filled 2018-04-14: qty 5
  Filled 2018-04-14 (×2): qty 10

## 2018-04-14 MED ORDER — FENTANYL CITRATE (PF) 100 MCG/2ML IJ SOLN
100.0000 ug | INTRAMUSCULAR | Status: DC | PRN
  Administered 2018-04-14 (×3): 100 ug via INTRAVENOUS
  Filled 2018-04-14 (×2): qty 2

## 2018-04-14 MED ORDER — FLEET ENEMA 7-19 GM/118ML RE ENEM: 1.0000 | ENEMA | RECTAL | Status: DC | PRN

## 2018-04-14 MED ORDER — LACTATED RINGERS IV SOLN: 500.0000 mL | Freq: Once | INTRAVENOUS | Status: DC

## 2018-04-14 NOTE — Anesthesia Pain Management Evaluation Note (Signed)
  [facility] Pain Management Visit Note  Patient: Carol [name], 32 y.o., female  "Hello I am a member of the anesthesia team at [facility]. We have an anesthesia team available at all times to provide care throughout the hospital, including epidural management and anesthesia for C-section. I don't know your plan for the delivery whether it a natural birth, water birth, IV sedation, nitrous supplementation, doula or epidural, but we want to meet your pain goals."   1.Was your pain managed to your expectations on prior hospitalizations?   Yes   2.What is your expectation for pain management during this hospitalization?     Epidural  3.How can we help you reach that goal? Epidural as indicated  Record the patient's initial score and the patient's pain goal.   Pain: 2  Pain Goal: 6 The [facility] wants you to be able to say your pain was always managed very well.  Bufford Spikes 04/14/2018

## 2018-04-14 NOTE — H&P (Signed)
Cyndia Degraff is a 32 y.o. female presenting for IOL for term LGA. OB History    Gravida  3   Para  1   Term  1   Preterm      AB  1   Living  1     SAB  1   TAB      Ectopic      Multiple      Live Births  1          Past Medical History:  Diagnosis Date  . Anxiety   . Depression    doing ok  . Migraines   . Vaginal Pap smear, abnormal    cryo therapy, ok since   Past Surgical History:  Procedure Laterality Date  . CRYOTHERAPY  2012  . NO PAST SURGERIES     Family History: family history includes Asthma in her mother; Cancer in her maternal grandfather, maternal grandmother, and mother; Gout in her father; Heart disease in her father; Lung disease in her mother and paternal grandfather. Social History:  reports that she has quit smoking. She has never used smokeless tobacco. She reports that she does not drink alcohol or use drugs.     Maternal Diabetes: No Genetic Screening: Normal Maternal Ultrasounds/Referrals: Normal Fetal Ultrasounds or other Referrals:  None Maternal Substance Abuse:  No Significant Maternal Medications:  Meds include: Zantac Significant Maternal Lab Results:  None Other Comments:  None  Review of Systems  Constitutional: Negative for fever.  Respiratory: Negative for shortness of breath.   Cardiovascular: Negative for chest pain and palpitations.  Gastrointestinal: Positive for heartburn.  Skin: Negative for itching and rash.   Maternal Medical History:  Fetal activity: Perceived fetal activity is normal.   Last perceived fetal movement was within the past hour.    Prenatal complications: no prenatal complications Prenatal Complications - Diabetes: none.    Dilation: 1.5 Effacement (%): 60 Station: -3 Exam by:: lee Blood pressure 115/71, pulse 84, height 5\' 7"  (1.702 m), weight 89.4 kg (197 lb), last menstrual period 07/12/2017. Maternal Exam:  Abdomen: Patient reports no abdominal tenderness. Introitus:  Normal vulva. Normal vagina.  Cervix: Cervix evaluated by digital exam.     Physical Exam  Constitutional: She is oriented to person, place, and time. She appears well-developed and well-nourished. No distress.  Cardiovascular: Normal rate, regular rhythm and normal heart sounds.  Respiratory: Effort normal and breath sounds normal.  Neurological: She is alert and oriented to person, place, and time.  Skin: Skin is warm.    Prenatal labs: ABO, Rh: --/--/O NEG (10/02 2007) Antibody: NEG Performed at [facility], [place] [address], [place], [place] [address]  [phone] 2007) Rubella: Immune (09/18 0000) RPR: Non Reactive (02/21 0812)  HBsAg: Negative (09/18 0000)  HIV: Non Reactive (02/21 6606)  GBS: Negative (04/18 0000)   Assessment/Plan:  [id number] admitted for IOL for term LGA.   Labor: Pitocin started at 8 AM, currently at 4, tolerating well, will check progress and augment accordingly. Foley bulb placed at 9:30 AM ID: GBS neg  Baby plans:  Boy " Barnabas Lister"; breastfeed, wants inpatient circumcision, will see [facility] OCP plans: undecided  Vira Browns, MD, PGY-1 Family Medicine -[place] 04/14/2018, 9:33 AM

## 2018-04-14 NOTE — Anesthesia Procedure Notes (Signed)
Epidural Patient location during procedure: OB Start time: 04/14/2018 2:47 PM End time: 04/14/2018 2:57 PM  Staffing Anesthesiologist: Nolon Nations, MD Performed: anesthesiologist   Preanesthetic Checklist Completed: patient identified, pre-op evaluation, timeout performed, IV checked, risks and benefits discussed and monitors and equipment checked  Epidural Patient position: sitting Prep: site prepped and draped and DuraPrep Patient monitoring: heart rate, continuous pulse ox and blood pressure Approach: midline Location: L3-L4 Injection technique: LOR air and LOR saline  Needle:  Needle type: Tuohy  Needle gauge: 17 G Needle length: 9 cm Needle insertion depth: 5 cm Catheter type: closed end flexible Catheter size: 19 Gauge Catheter at skin depth: 10 cm Test dose: negative  Assessment Sensory level: T8 Events: blood not aspirated, injection not painful, no injection resistance, negative IV test and no paresthesia  Additional Notes Reason for block:procedure for pain

## 2018-04-14 NOTE — Progress Notes (Signed)
Carol [name] is a 32 y.o. G3P1011 at [redacted]w[redacted]d by LMP admitted for induction of labor due to [place].  Subjective: Good pain control, no complaints  Objective: BP 119/66   Pulse 78   Resp 16   Ht 5\' 7"  (1.702 m)   Wt 89.4 kg (197 lb)   LMP 07/12/2017   BMI 30.85 kg/m  No intake/output data recorded. No intake/output data recorded.  FHT:  FHR: 130 bpm, variability: moderate,  accelerations:  Present,  decelerations:  Absent UC:   irregular, every 3-5 minutes SVE:   Dilation: 4 Effacement (%): 80 Station: -2 Exam by:: IDHWYS,HUO  Labs: Lab Results  Component Value Date   WBC 11.9 (H) 04/14/2018   HGB 11.2 (L) 04/14/2018   HCT 32.5 (L) 04/14/2018   MCV 90.3 04/14/2018   PLT 323 04/14/2018    Assessment / Plan: Induction of labor due to LGA,  progressing well on pitocin  Labor: AROM @1410  Preeclampsia:  no signs or symptoms of toxicity Fetal Wellbeing:  Category I Pain Control:  Epidural to be placed I/D:  n/a Anticipated MOD:  NSVD  Vira Browns, MD, PGY-1 Family Medicine [facility] 04/14/2018, 2:28 PM

## 2018-04-14 NOTE — Anesthesia Preprocedure Evaluation (Signed)
Anesthesia Evaluation  Patient identified by MRN, date of birth, ID band Patient awake    Reviewed: Allergy & Precautions, NPO status , Patient's Chart, lab work & pertinent test results  Airway Mallampati: II  TM Distance: >3 FB Neck ROM: Full    Dental no notable dental hx.    Pulmonary former smoker,    Pulmonary exam normal breath sounds clear to auscultation       Cardiovascular negative cardio ROS Normal cardiovascular exam Rhythm:Regular Rate:Normal     Neuro/Psych  Headaches, PSYCHIATRIC DISORDERS Anxiety Depression    GI/Hepatic negative GI ROS, Neg liver ROS,   Endo/Other  negative endocrine ROS  Renal/GU negative Renal ROS     Musculoskeletal  (+) Arthritis ,   Abdominal   Peds  Hematology negative hematology ROS (+)   Anesthesia Other Findings   Reproductive/Obstetrics (+) Pregnancy                             Anesthesia Physical Anesthesia Plan  ASA: III  Anesthesia Plan: Epidural   Post-op Pain Management:    Induction:   PONV Risk Score and Plan:   Airway Management Planned:   Additional Equipment:   Intra-op Plan:   Post-operative Plan:   Informed Consent: I have reviewed the patients History and Physical, chart, labs and discussed the procedure including the risks, benefits and alternatives for the proposed anesthesia with the patient or authorized representative who has indicated his/her understanding and acceptance.     Plan Discussed with:   Anesthesia Plan Comments:         Anesthesia Quick Evaluation

## 2018-04-15 NOTE — Anesthesia Postprocedure Evaluation (Signed)
Anesthesia Post Note  Patient: Nutritional therapist  Procedure(s) Performed: AN AD HOC LABOR EPIDURAL     Patient location during evaluation: Mother Baby Anesthesia Type: Epidural Level of consciousness: awake and alert Pain management: pain level controlled Vital Signs Assessment: post-procedure vital signs reviewed and stable Respiratory status: spontaneous breathing, nonlabored ventilation and respiratory function stable Cardiovascular status: stable Postop Assessment: no headache, no backache, epidural receding, no apparent nausea or vomiting, able to ambulate, patient able to bend at knees and adequate PO intake Anesthetic complications: no    Last Vitals:  Vitals:   04/15/18 0010 04/15/18 0500  BP: (!) 106/56 103/63  Pulse: 90 64  Resp: 19 17  Temp: 37.3 C 36.6 C  SpO2: 99% 97%    Last Pain:  Vitals:   04/15/18 0500  TempSrc: Oral  PainSc: 3    Pain Goal:                 [employer]

## 2018-04-15 NOTE — Lactation Note (Signed)
This note was copied from a baby's chart. Lactation Consultation Note;   Mother reports that her nipples are slightly sore. Mother reports that staff nurse is going to give her coconut oil. Mother advised to hand express drops of colostrum after each feeding. Discussed proper alignment of infants body when latching. Encouraged to use good support and rotate positions frequently.  Mother advised to page when infant feeds again. Father is doing skin to skin at present and infant is sleeping.  Mother advised to continue to cue base feed infant and feed at least 8-12 times in 24 hours.  Mother receptive to teaching.   Patient Name: Carol [name] JOACZ'Y Date: 04/15/2018 Reason for consult: Follow-up assessment   Maternal Data    Feeding Feeding Type: Breast Fed Length of feed: 15 min  LATCH Score                   Interventions    Lactation Tools Discussed/Used     Consult Status Consult Status: Follow-up Date: 04/16/18 Follow-up type: In-patient    Jess Barters [facility] 04/15/2018, 11:50 AM

## 2018-04-15 NOTE — Lactation Note (Signed)
This note was copied from a baby's chart. Lactation Consultation Note  Patient Name: Carol [name] MVEHM'C Date: 04/15/2018 Reason for consult: Initial assessment;Term  P2 mother whose infant is now 56 hours old.  Mother has breastfeeding experience with her first child almost 5 years ago.  Infant latched and feeding as I entered the room.  Mother states that her nipples hurt.  With better positioning and pillow support I was able to assist in latching the baby onto the right breast in the football hold with no pain.  Stressed the importance of getting and maintaining a deep latch.  Reviewed breast massage and hand expression with mother.  Colostrum drops flowed freely with hand expression and a small plastic container was provided to save any EBM she may obtain.    Encouraged feeding 8-12 times/24 hours or earlier if baby shows feeding cues.  Reviewed feeding cues with mother.  Mom made aware of O/P services, breastfeeding support groups, community resources, and our phone # for post-discharge questions. Mother will call for assistance as needed.  No further questions/concerns at this time. Maternal Data Formula Feeding for Exclusion: No Has patient been taught Hand Expression?: Yes Does the patient have breastfeeding experience prior to this delivery?: Yes  Feeding Feeding Type: Breast Fed Length of feed: 25 min  LATCH Score Latch: Grasps breast easily, tongue down, lips flanged, rhythmical sucking.  Audible Swallowing: Spontaneous and intermittent  Type of Nipple: Everted at rest and after stimulation  Comfort (Breast/Nipple): Soft / non-tender  Hold (Positioning): Assistance needed to correctly position infant at breast and maintain latch.  LATCH Score: 9  Interventions Interventions: Breast feeding basics reviewed;Assisted with latch;Skin to skin;Breast massage;Hand express;Position options;Support pillows;Adjust position;Breast compression  Lactation Tools  Discussed/Used     Consult Status Consult Status: Follow-up Date: 04/16/18 Follow-up type: In-patient    Tayven Renteria R Evyn Kooyman 04/15/2018, 5:25 AM

## 2018-04-15 NOTE — Lactation Note (Signed)
This note was copied from a baby's chart. Lactation Consultation Note  Patient Name: Carol [name] VOZDG'U Date: 04/15/2018 Reason for consult: Follow-up assessment   Family requested assistance.  Baby has been sleepy and wanted waking techniques. Demonstrated how to spoon feed baby.  Gave him drops. When he started cueing mother latched him in football hold on R side. Observed rhythmical sucks and swallows. Encouraged mother to compress breast during feeding. Mom encouraged to feed baby 8-12 times/24 hours and with feeding cues.  Provided mother w/ shells because she states she has sensitive nipples. Shells can be used with ebm or coconut oil.   Maternal Data    Feeding Feeding Type: Breast Fed Length of feed: 15 min  LATCH Score                   Interventions    Lactation Tools Discussed/Used     Consult Status Consult Status: Follow-up Date: 04/16/18 Follow-up type: In-patient    Vivianne Master [facility] 04/15/2018, 12:20 PM

## 2018-04-15 NOTE — Progress Notes (Signed)
Post Partum Day 1 Subjective: no complaints, up ad lib, voiding and tolerating PO  Objective: Blood pressure 103/63, pulse 64, temperature 97.9 F (36.6 C), temperature source Oral, resp. rate 17, height 5\' 7"  (1.702 m), weight 89.4 kg (197 lb), last menstrual period 07/12/2017, SpO2 97 %, unknown if currently breastfeeding.  Physical Exam:  General: alert, cooperative and no distress Lochia: appropriate Uterine Fundus: firm Incision: N/A DVT Evaluation: No evidence of DVT seen on physical exam. No cords or calf tenderness. No significant calf/ankle edema.  Recent Labs    04/14/18 0757  HGB 11.2*  HCT 32.5*    Assessment/Plan: Plan for discharge tomorrow, Breastfeeding and Lactation consult   LOS: 1 day   Carol Blare, DO 04/15/2018, 9:17 AM

## 2018-04-16 LAB — BIRTH TISSUE RECOVERY COLLECTION (PLACENTA DONATION)

## 2018-04-16 MED ORDER — IBUPROFEN 600 MG PO TABS: 600.0000 mg | ORAL_TABLET | Freq: Four times a day (QID) | ORAL | 0 refills | Status: DC

## 2018-04-16 NOTE — Progress Notes (Signed)
Attending Circumcision Counseling Progress Note  Patient desires circumcision for her female infant.  Circumcision procedure details discussed, risks and benefits of procedure were also discussed.  These include but are not limited to: Benefits of circumcision in men include reduction in the rates of urinary tract infection (UTI), penile cancer, some sexually transmitted infections, penile inflammatory and retractile disorders, as well as easier hygiene.  Risks include bleeding , infection, injury of glans which may lead to penile deformity or urinary tract issues, unsatisfactory cosmetic appearance and other potential complications related to the procedure.  It was emphasized that this is an elective procedure.  Patient wants to proceed with circumcision; written informed consent obtained.  Will do circumcision soon, routine circumcision and post circumcision care ordered for the infant.  Verita Schneiders, M.D. 04/16/2018 12:21 PM

## 2018-04-16 NOTE — Discharge Instructions (Signed)

## 2018-04-16 NOTE — Discharge Summary (Signed)
OB Discharge Summary     Patient Name: Carol [name] DOB: 1986/04/11 MRN: [id number]  Date of admission: 04/14/2018 Delivering MD: Katheren Shams   Date of discharge: 04/16/2018  Admitting diagnosis: INDUCTION Intrauterine pregnancy: [redacted]w[redacted]d     Secondary diagnosis:  Active Problems:   LGA (large for gestational age) fetus affecting management of mother   SVD (spontaneous vaginal delivery)  Additional problems: None     Discharge diagnosis: Term Pregnancy Delivered                                                                                                Post partum procedures:none  Augmentation: AROM, Pitocin and Foley Balloon  Complications: None  Hospital course:  Induction of Labor With Vaginal Delivery   32 y.o. yo [id number] at [redacted]w[redacted]d was admitted to the hospital 04/14/2018 for induction of labor.  Indication for induction: term/.  Patient had an uncomplicated labor course as follows: Membrane Rupture Time/Date: 2:04 PM ,04/14/2018   Intrapartum Procedures: Episiotomy: None [1]                                         Lacerations:  1st degree [2];Perineal [11]  Patient had delivery of a Viable infant.  Information for the patient's newborn:  Anaalicia, Reimann [[id number]]  Delivery Method: Vag-Spont   04/14/2018  Details of delivery can be found in separate delivery note.  Patient had a routine postpartum course. Patient is discharged home 04/16/18.  Physical exam  Vitals:   04/15/18 0500 04/15/18 1000 04/15/18 1720 04/16/18 0606  BP: 103/63 108/63 108/69 110/82  Pulse: 64 63 64 (!) 54  Resp: 17 16 20 18   Temp: 97.9 F (36.6 C) 98.3 F (36.8 C) 98 F (36.7 C) 97.8 F (36.6 C)  TempSrc: Oral Oral Oral Oral  SpO2: 97% 98% 98%   Weight:      Height:       General: alert, cooperative and no distress Lochia: appropriate Uterine Fundus: firm Incision: N/A DVT Evaluation: No evidence of DVT seen on physical exam. No cords or calf tenderness. Labs: Lab  Results  Component Value Date   WBC 11.9 (H) 04/14/2018   HGB 11.2 (L) 04/14/2018   HCT 32.5 (L) 04/14/2018   MCV 90.3 04/14/2018   PLT 323 04/14/2018   No flowsheet data found.  Discharge instruction: per After Visit Summary and "Baby and Me Booklet".  After visit meds:  Allergies as of 04/16/2018      Reactions   Codeine Nausea And Vomiting      Medication List    STOP taking these medications   cyclobenzaprine 5 MG tablet Commonly known as:  FLEXERIL   PRENATAL 1+1 PO   promethazine 25 MG tablet Commonly known as:  PHENERGAN     TAKE these medications   acetaminophen 500 MG tablet Commonly known as:  TYLENOL Take 500 mg by mouth every 6 (six) hours as needed for moderate pain.   diphenhydrAMINE 25 MG tablet Commonly known  as:  BENADRYL Take 25 mg by mouth at bedtime as needed for sleep.   ferrous gluconate 324 MG tablet Commonly known as:  FERGON Take 1 tablet (324 mg total) by mouth daily with breakfast.   ibuprofen 600 MG tablet Commonly known as:  ADVIL,MOTRIN Take 1 tablet (600 mg total) by mouth every 6 (six) hours.   prenatal multivitamin Tabs tablet Take 1 tablet by mouth daily at 12 noon.   ranitidine 150 MG tablet Commonly known as:  ZANTAC TAKE 1 TABLET BY MOUTH TWICE A DAY       Diet: routine diet  Activity: Advance as tolerated. Pelvic rest for 6 weeks.   Outpatient follow up:6 weeks Follow up Appt: Future Appointments  Date Time Provider [place]  05/25/2018 10:15 AM Donnamae Jude, MD [facility]   Follow up Visit:No follow-ups on file.  Postpartum contraception: Progesterone only pills  Newborn Data: Live born female  Birth Weight: 8 lb 11.2 oz (3946 g) APGAR: 8, 9  Newborn Delivery   Birth date/time:  04/14/2018 20:40:51 Delivery type:  Vaginal, Spontaneous     Baby Feeding: Breast Disposition:home with mother   04/16/2018 Luiz Blare, DO

## 2018-04-16 NOTE — Progress Notes (Signed)
CSW received consult for hx of Anxiety and Depression.  CSW met with MOB to offer support and complete assessment.    When CSW arrived, MOB was in bed eating breakfast and FOB was laying on the couch engaging in skin to skin with infant.  FOB appeared comfortable and responded appropriately to infant's cues.  CSW explained CSW's role and MOB gave CSW permission to complete the assessment while FOB was present. The couple appeared to be supportive of one another, they were easy to engage, and was receptive to meeting with CSW.   CSW asked about MOB's MH hx and MOB openly share that MOB was dx with anxiety/depression while MOB was in high school.  MOB reported that MOB has been prescribed numerous medication to help with symptoms and Zoloft seems to work better.  MOB reported that MOB discontinued her Zoloft in 2018 and has experienced little to no symptoms. CSW provided education regarding the baby blues period vs. perinatal mood disorders, discussed treatment and gave resources for mental health follow up if concerns arise.  CSW recommends self-evaluation during the postpartum time period using the New Mom Checklist from Postpartum Progress and encouraged MOB to contact a medical professional if symptoms are noted at any time. MOB expressed with confidence that MOB feels comfortable reaching out to [facility] provider if PPD symptoms present. CSW assessed for safety and MOB denied SI and HI.   MOB shared feeling excited about being a new mother again an declined resources for outpatient counseling.   CSW provided review of Sudden Infant Death Syndrome (SIDS) precautions.    CSW identifies no further need for intervention and no barriers to discharge at this time.  Laurey Arrow, MSW, LCSW Clinical Social Work [phone]

## 2018-04-16 NOTE — Lactation Note (Signed)
This note was copied from a baby's chart. Lactation Consultation Note  Patient Name: Carol [name] ONGEX'B Date: 04/16/2018 Reason for consult: Follow-up assessment;Infant weight loss(milk is in both breast )  Baby is 56 hours old  LC reviewed and updated doc flow sheets.  As LC entered room, mom holding baby trying to feed EBM from a bottle. Baby sleepy and  Last fed at the breast at 9:30 am. Per mom has pumped off the over fullness and did not want to  Waist the milk. Baby is for a circ. LC recommended since the baby fed at 910 am 16 mins , 9:30 for  10 mins. Baby is for circ this am or early afternoon to hold off so the baby doesn't spit up while having a  Circ.  Sore nipple and engorgement prevention and tx reviewed. Per mom will have a hand pump and a  DEBP - Medela.  Sore nipple and engorgement prevention and tx reviewed. Per mom nipples are feeling alittle sore  And the RN gave mom shells and coconut oil. Also mom concerned breast are engorged.  Mom checked the breast with moms permission and noted them to have areas of fullness but not  Engorged.  LC recommended if the breast gel fuller, and baby has gone for a circ, to release down for 10 mins  To prevent soreness.  Mother informed of post-discharge support and given phone number to the lactation department, including services for phone call assistance; out-patient appointments; and breastfeeding support group. List of other breastfeeding resources in the community given in the handout. Encouraged mother to call for problems or concerns related to breastfeeding.    Maternal Data Has patient been taught Hand Expression?: Yes  Feeding Feeding Type: (baby recently fed / sleepy now ) Length of feed: 10 min  LATCH Score                   Interventions Interventions: Breast feeding basics reviewed  Lactation Tools Discussed/Used Tools: Shells;Pump Shell Type: Inverted Breast pump type: Double-Electric  Breast Pump WIC Program: No Pump Review: Milk Storage;Setup, frequency, and cleaning(reviewed )   Consult Status Consult Status: Complete Date: 04/16/18    [name] 04/16/2018, 11:52 AM

## 2018-04-18 ENCOUNTER — Telehealth (HOSPITAL_COMMUNITY): Payer: Self-pay | Admitting: Lactation Services

## 2018-04-18 LAB — BPAM RBC
BLOOD PRODUCT EXPIRATION DATE: 201906032359
BLOOD PRODUCT EXPIRATION DATE: 201906042359
UNIT TYPE AND RH: 9500
UNIT TYPE AND RH: 9500

## 2018-04-18 LAB — TYPE AND SCREEN
ABO/RH(D): O NEG
ANTIBODY SCREEN: POSITIVE
UNIT DIVISION: 0
UNIT DIVISION: 0

## 2018-04-18 NOTE — Telephone Encounter (Signed)
Allecia called asking questions about engorgement.  Discharged Friday 5/10 and breasts filling up and painful Saturday early am.   Called back Sunday 5/12 8 am.  Breasts are softening, and pumping 2-3 oz each time.  Baby breastfeeding and softening breasts.    Engorgement prevention and treatment discussed.  To call prn for any concerns.

## 2018-04-20 ENCOUNTER — Encounter: Payer: Self-pay | Admitting: Obstetrics & Gynecology

## 2018-04-21 ENCOUNTER — Telehealth: Payer: Self-pay | Admitting: Obstetrics and Gynecology

## 2018-04-21 ENCOUNTER — Ambulatory Visit
Admission: RE | Admit: 2018-04-21 | Discharge: 2018-04-21 | Disposition: A | Discharge status: Home / Self Care | Payer: 59 | Source: Ambulatory Visit | Attending: Family Medicine | Admitting: Family Medicine

## 2018-04-21 ENCOUNTER — Ambulatory Visit: Payer: 59 | Admitting: Family Medicine

## 2018-04-21 ENCOUNTER — Encounter: Payer: Self-pay | Admitting: Family Medicine

## 2018-04-21 VITALS — BP 159/78 | HR 48 | Ht 67.0 in | Wt 187.0 lb

## 2018-04-21 DIAGNOSIS — R1011 Right upper quadrant pain: Secondary | ICD-10-CM | POA: Diagnosis not present

## 2018-04-21 DIAGNOSIS — R030 Elevated blood-pressure reading, without diagnosis of hypertension: Secondary | ICD-10-CM

## 2018-04-21 DIAGNOSIS — R9389 Abnormal findings on diagnostic imaging of other specified body structures: Secondary | ICD-10-CM | POA: Insufficient documentation

## 2018-04-21 MED ORDER — HYDROCHLOROTHIAZIDE 25 MG PO TABS
25.0000 mg | ORAL_TABLET | Freq: Every day | ORAL | 0 refills | Status: DC
Start: 2018-04-21 — End: 2018-04-26

## 2018-04-21 NOTE — Progress Notes (Signed)
   OB/GYN ENCOUNTER NOTE  Subjective:   Carol [name] is a 32 y.o. G82P2012 female here for  RUQ/epigastric pain.  Currently 7 d pp from vaginal delivery with minimal but appropriate bleeding.  Here today with RUQ pain that started 3d prior. Rates pain as 3/10-- annoying and intermittent.  Worse with walking and palpation. No association with eating.Denies fever, chills, nausea, vomiting. No hx of cholecystectomy. Denies HA, scotomata, blurry vision. Currently have RUQ pain and edema.   No history of prex in this pregnancy.    Denies abnormal vaginal bleeding, discharge, pelvic pain, or other gynecologic concerns.    The following portions of the patient's history were reviewed and updated as appropriate: allergies, current medications, past family history, past medical history, past social history, past surgical history and problem list.  Review of Systems Pertinent items are noted in HPI.   Objective:  BP (!) 159/78   Pulse (!) 48   Ht 5\' 7"  (1.702 m)   Wt 187 lb (84.8 kg)   LMP 07/12/2017   Breastfeeding? Yes   BMI 29.29 kg/m   Gen: well appearing, NAD HEENT: no scleral icterus CV: RR Lung: Normal WOB Abd:  TTP in epigastric region and RUQ. + Murphy's sign. Otherwise non-tender/non-distended. Normal bowel sound. No masses.  Ext: warm well perfused. 2+ pitting in feet.   . Assessment and Plan:   1. RUQ pain - ddx includes preeclampsia, cholelithiasis, MSK pain.  - CBC - Comprehensive metabolic panel - US ABDOMEN LIMITED RUQ; Future - US ABDOMEN LIMITED RUQ; Future - Reviewed reasons to seek care if sx worsen.   2. Elevated BP without diagnosis of hypertension - BP was rechecked and was in mild range (150s/90) but still elevated even after 10 minutes of rest.  - Given edema and elevated BP will start HCTZ - hydrochlorothiazide (HYDRODIURIL) 25 MG tablet; Take 1 tablet (25 mg total) by mouth daily.  Dispense: 5 tablet; Refill: 0 - Reviewed warning s/sx of  preeclampsia   Please refer to After Visit Summary for other counseling recommendations.   Return in about 1 day (around 04/22/2018) for BP check .  Caren Macadam, MD, MPH, ABFM Attending [place] for [facility]

## 2018-04-21 NOTE — Progress Notes (Signed)
Pt here for eval of epigastric pain. Started Sunday, 3 days ago. She is 7 days Postpartum, vaginal delivery.

## 2018-04-21 NOTE — Telephone Encounter (Signed)
Call received from [place] [facility] u/s:  Gall bladder u/s report called to me:    CLINICAL DATA:  Right upper quadrant pain  EXAM: ULTRASOUND ABDOMEN LIMITED RIGHT UPPER QUADRANT  COMPARISON:  None.  FINDINGS: Gallbladder:  No gallstones are evident. There is thickening in portions of the gallbladder wall with slight pericholecystic fluid. No sonographic Murphy sign noted by sonographer.  Common bile duct:  Diameter: 3 mm. No intrahepatic or extrahepatic biliary duct dilatation.  Liver:  No focal lesion identified. Within normal limits in parenchymal echogenicity. Portal vein is patent on color Doppler imaging with normal direction of blood flow towards the liver.  IMPRESSION: Thickening of the gallbladder wall with mild pericholecystic fluid. No gallstones evident. The appearance of the gallbladder is concerning for a degree of acalculus cholecystitis.  Study otherwise unremarkable.  These results will be called to the ordering clinician or representative by the Radiologist Assistant, and communication documented in the PACS or zVision Dashboard.   Electronically Signed   By: Lowella Grip III M.D.   On: 04/21/2018 17:28    Pt has APPT in a.m. For f/u BP check due to pressure 90 diastolic. Will have GB report reviewed. At the time of the u/s , there was NO Murphy's sign, and pt was essentially pain free.

## 2018-04-22 LAB — CBC
HEMOGLOBIN: 11.2 g/dL (ref 11.1–15.9)
Hematocrit: 33.4 % — ABNORMAL LOW (ref 34.0–46.6)
MCH: 30.4 pg (ref 26.6–33.0)
MCHC: 33.5 g/dL (ref 31.5–35.7)
MCV: 91 fL (ref 79–97)
PLATELETS: 395 10*3/uL — AB (ref 150–379)
RBC: 3.68 x10E6/uL — AB (ref 3.77–5.28)
RDW: 14.2 % (ref 12.3–15.4)
WBC: 7.3 10*3/uL (ref 3.4–10.8)

## 2018-04-22 LAB — COMPREHENSIVE METABOLIC PANEL
A/G RATIO: 1.9 (ref 1.2–2.2)
ALT: 35 IU/L — AB (ref 0–32)
AST: 25 IU/L (ref 0–40)
Albumin: 3.7 g/dL (ref 3.5–5.5)
Alkaline Phosphatase: 93 IU/L (ref 39–117)
BILIRUBIN TOTAL: 0.4 mg/dL (ref 0.0–1.2)
BUN/Creatinine Ratio: 23 (ref 9–23)
BUN: 15 mg/dL (ref 6–20)
CALCIUM: 8.5 mg/dL — AB (ref 8.7–10.2)
CO2: 18 mmol/L — ABNORMAL LOW (ref 20–29)
Chloride: 108 mmol/L — ABNORMAL HIGH (ref 96–106)
Creatinine, Ser: 0.66 mg/dL (ref 0.57–1.00)
GFR, EST AFRICAN AMERICAN: 136 mL/min/{1.73_m2} (ref >59)
GFR, EST NON AFRICAN AMERICAN: 118 mL/min/{1.73_m2} (ref >59)
Globulin, Total: 2 g/dL (ref 1.5–4.5)
Glucose: 67 mg/dL (ref 65–99)
Potassium: 3.8 mmol/L (ref 3.5–5.2)
Sodium: 143 mmol/L (ref 134–144)
TOTAL PROTEIN: 5.7 g/dL — AB (ref 6.0–8.5)

## 2018-04-22 MED ORDER — ACETAMINOPHEN 325 MG PO TABS
650.00 | ORAL_TABLET | ORAL | Status: DC
Start: ? — End: 2018-04-22

## 2018-04-22 MED ORDER — HYDROCHLOROTHIAZIDE 25 MG PO TABS
25.00 | ORAL_TABLET | ORAL | Status: DC
Start: 2018-04-23 — End: 2018-04-22

## 2018-04-22 MED ORDER — PNV PRENATAL PLUS MULTIVITAMIN 27-1 MG PO TABS
1.00 | ORAL_TABLET | ORAL | Status: DC
Start: 2018-04-23 — End: 2018-04-22

## 2018-04-22 MED ORDER — NIFEDIPINE ER OSMOTIC RELEASE 30 MG PO TB24
30.00 | ORAL_TABLET | ORAL | Status: DC
Start: 2018-04-23 — End: 2018-04-22

## 2018-04-26 ENCOUNTER — Encounter: Payer: Self-pay | Admitting: Obstetrics & Gynecology

## 2018-04-26 ENCOUNTER — Encounter: Payer: Self-pay | Admitting: Obstetrics and Gynecology

## 2018-04-26 ENCOUNTER — Other Ambulatory Visit: Payer: Self-pay | Admitting: Family Medicine

## 2018-04-26 ENCOUNTER — Ambulatory Visit (INDEPENDENT_AMBULATORY_CARE_PROVIDER_SITE_OTHER): Payer: 59 | Admitting: Obstetrics and Gynecology

## 2018-04-26 VITALS — BP 135/86 | Ht 67.0 in | Wt 168.0 lb

## 2018-04-26 DIAGNOSIS — O165 Unspecified maternal hypertension, complicating the puerperium: Secondary | ICD-10-CM

## 2018-04-26 DIAGNOSIS — R932 Abnormal findings on diagnostic imaging of liver and biliary tract: Secondary | ICD-10-CM

## 2018-04-26 DIAGNOSIS — R030 Elevated blood-pressure reading, without diagnosis of hypertension: Secondary | ICD-10-CM

## 2018-04-26 NOTE — Progress Notes (Signed)
Obstetrics and Gynecology Established Patient Evaluation  Appointment Date: 04/26/2018  OBGYN Clinic: Center for [facility]  Primary Care Provider: Patient, No Pcp Per  Chief Complaint:  Chief Complaint  Patient presents with  . Follow-up    History of Present Illness: Carol [name] is a 32 y.o. Caucasian [id number] (No LMP recorded.), seen for the above chief complaint. Her past medical history is significant for nothing.   She is s/p  5/8 SVD with IOL LGA. She was discharge to home on PPD#2 and was doing well and w/o any issues. Pt seen on 5/15 for RUQ pain and diagnosed with elevated BPs near the severe range. She had an u/s scheduled and put on hctz 25 qday. U/s showed possible acalculus cholecystitis. She went to the [facility] ED the next day b/c her BP app flagged the values and suggested she go for eval.  She was diagnosed with severe gHTN and was on Mg initially and then transitioned off it. She was discharged and told to continue hctz 25 qday but had procardia xl 30 qday. She didn't have an elevated wbc and no s/s during that hospitalization.  Pt states no s/s of pre-eclampsia, including no abdominal pain/ruq pain.   Review of Systems:  as noted in the History of Present Illness.  Patient Active Problem List   Diagnosis Date Noted  . Abnormal gallbladder ultrasound 04/26/2018  . LGA (large for gestational age) fetus affecting management of mother 04/14/2018  . SVD (spontaneous vaginal delivery) 04/14/2018  . Heart burn 02/03/2018  . Migraines   . Chronic headache disorder 04/22/2017  . 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 Musculoskeletal disorder of neck 04/22/2017  . Osteoarthritis 04/22/2017    Past Medical History:  Past Medical History:  Diagnosis Date  . Anxiety   . Depression    doing ok  . Migraines   . Vaginal Pap smear, abnormal    cryo therapy, ok since    Past Surgical History:  Past Surgical History:  Procedure Laterality Date  .  CRYOTHERAPY  2012    Past Obstetrical History:  OB History  Gravida Para Term Preterm AB Living  3 2 2   1 2   SAB TAB Ectopic Multiple Live Births  1     0 2    # Outcome Date GA Lbr Len/2nd Weight Sex Delivery Anes PTL Lv  3 Term 04/14/18 [redacted]w[redacted]d 03:09 / 03:27 8 lb 11.2 oz (3.946 kg) M Vag-Spont EPI  LIV  2 SAB 12/2016 [redacted]w[redacted]d         1 Term 05/27/14 [redacted]w[redacted]d  8 lb 10 oz (3.912 kg) F Vag-Forceps EPI  LIV     Birth Comments: pushed 4 hours, forceps used for delivery    Past Gynecological History: As per HPI.  Social History:  Social History   Socioeconomic History  . Marital status: Married    Spouse name: Not on file  . Number of children: Not on file  . Years of education: Not on file  . Highest education level: Not on file  Occupational History  . Not on file  Social Needs  . Financial resource strain: Not on file  . Food insecurity:    Worry: Not on file    Inability: Not on file  . Transportation needs:    Medical: Not on file    Non-medical: Not on file  Tobacco Use  . Smoking status: Former Research scientist (life sciences)  . Smokeless tobacco: Never Used  . Tobacco comment: prior to first preg  Substance and Sexual Activity  . Alcohol use: No  . Drug use: No  . Sexual activity: Yes    Birth control/protection: None  Lifestyle  . Physical activity:    Days per week: Not on file    Minutes per session: Not on file  . Stress: Not on file  Relationships  . Social connections:    Talks on phone: Not on file    Gets together: Not on file    Attends religious service: Not on file    Active member of club or organization: Not on file    Attends meetings of clubs or organizations: Not on file    Relationship status: Not on file  . Intimate partner violence:    Fear of current or ex partner: Not on file    Emotionally abused: Not on file    Physically abused: Not on file    Forced sexual activity: Not on file  Other Topics Concern  . Not on file  Social History Narrative  . Not on  file    Family History:  Family History  Problem Relation Age of Onset  . Lung disease Mother   . Asthma Mother   . Cancer Mother        skin  . Gout Father   . Heart disease Father        a-fib  . Cancer Maternal Grandmother        breast, pancreatic  . Cancer Maternal Grandfather        colon  . Lung disease Paternal Grandfather     Medications Carol [name] had no medications administered during this visit. Current Outpatient Medications  Medication Sig Dispense Refill  . acetaminophen (TYLENOL) 500 MG tablet Take 500 mg by mouth every 6 (six) hours as needed for moderate pain.     . hydrochlorothiazide (HYDRODIURIL) 25 MG tablet TAKE 1 TABLET BY MOUTH EVERY DAY 30 tablet 3  . ibuprofen (ADVIL,MOTRIN) 600 MG tablet Take 1 tablet (600 mg total) by mouth every 6 (six) hours. 30 tablet 0  . NIFEdipine (PROCARDIA-XL/ADALAT-CC/NIFEDICAL-XL) 30 MG 24 hr tablet Take by mouth.    . Prenatal Vit-Fe Fumarate-FA (PRENATAL MULTIVITAMIN) TABS tablet Take 1 tablet by mouth daily at 12 noon.     No current facility-administered medications for this visit.     Allergies Codeine   Physical Exam:  BP 135/86   Ht 5\' 7"  (1.702 m)   Wt 168 lb (76.2 kg)   Breastfeeding? Yes   BMI 26.31 kg/m  Body mass index is 26.31 kg/m. General appearance: Well nourished, well developed female in   Laboratory: no new labs  Radiology: none  Assessment: pt doing well  Plan:  1. Abnormal gallbladder ultrasound F/u nv. I d/w her that can consider repeat scan, GSU referral, or exp management  2. Postpartum hypertension Doing well. precautions given and told to continue bid.   RTC 10 days  Durene Romans MD Attending Center for [facility])

## 2018-05-05 ENCOUNTER — Ambulatory Visit (INDEPENDENT_AMBULATORY_CARE_PROVIDER_SITE_OTHER): Payer: 59 | Admitting: Family Medicine

## 2018-05-05 ENCOUNTER — Encounter: Payer: Self-pay | Admitting: Family Medicine

## 2018-05-05 VITALS — BP 105/73 | HR 72 | Wt 165.2 lb

## 2018-05-05 DIAGNOSIS — R030 Elevated blood-pressure reading, without diagnosis of hypertension: Secondary | ICD-10-CM

## 2018-05-05 NOTE — Progress Notes (Signed)
Obstetrics and Gynecology Established Patient Evaluation  Appointment Date: 05/05/18 OBGYN Clinic: [facility] for [facility]  Chief Complaint: f/u BP  History of Present Illness: Keeli Roberg is a 32 y.o. Caucasian [id number] (No LMP recorded.), seen for the above chief complaint. Her past medical history is significant for nothing.   She is s/p  5/8 SVD with IOL LGA. She was discharge to home on PPD#2 and was doing well and w/o any issues. Pt seen on 5/15 for RUQ pain and diagnosed with elevated BPs near the severe range. She had an u/s scheduled and put on hctz 25 qday. U/s showed possible acalculus cholecystitis. She went to the [facility] ED the next day b/c her BP app flagged the values and suggested she go for eval.  She was diagnosed with severe gHTN and was on Mg initially and then transitioned off it. She was discharged and told to continue hctz 25 qday but had procardia xl 30 qday. She didn't have an elevated wbc and no s/s during that hospitalization.  Pt states no s/s of pre-eclampsia, including no abdominal pain/ruq pain.   Since last visit at [facility] office, BP have been low 100s-110s /70s Currently breastfeeding - infant showing left preference but no issues with supply  Review of Systems:  as noted in the History of Present Illness.  Physical Exam:  BP 105/73   Pulse 72   Wt 165 lb 3.2 oz (74.9 kg)   BMI 25.87 kg/m  Body mass index is 25.87 kg/m. General appearance: Well nourished, well developed female in  CV: RR Lung: normal WOB Ext: no edema  Laboratory: no new labs  Radiology: none  Assessment: pt doing well  Plan:  #Postpartum hypertension Will d/c HCTZ today given low BPs  Continue procardia   Future Appointments  Date Time Provider [place]  05/25/2018 10:15 AM Donnamae Jude, MD [facility]    Caren Macadam, MD, MPH, ABFM Attending [place] for [facility]

## 2018-05-05 NOTE — Patient Instructions (Signed)
   [email or web address]

## 2018-05-06 ENCOUNTER — Encounter: Payer: Self-pay | Admitting: Obstetrics & Gynecology

## 2018-05-19 ENCOUNTER — Encounter: Payer: Self-pay | Admitting: Family Medicine

## 2018-05-25 ENCOUNTER — Ambulatory Visit (INDEPENDENT_AMBULATORY_CARE_PROVIDER_SITE_OTHER): Payer: 59 | Admitting: Family Medicine

## 2018-05-25 ENCOUNTER — Encounter: Payer: Self-pay | Admitting: Family Medicine

## 2018-05-25 ENCOUNTER — Encounter: Payer: Self-pay | Admitting: *Deleted

## 2018-05-25 MED ORDER — HYDROCORTISONE ACETATE 25 MG RE SUPP: 25.0000 mg | Freq: Two times a day (BID) | RECTAL | 1 refills | Status: DC

## 2018-05-25 NOTE — Progress Notes (Signed)
Post Partum Exam  Carol [name] is a 31 y.o. G40P2012 female who presents for a postpartum visit. She is 6 weeks postpartum following a spontaneous vaginal delivery. I have fully reviewed the prenatal and intrapartum course. The delivery was at 39.3 gestational weeks.  Anesthesia: epidural. Postpartum course has been postpartum preclampsia. Baby's course has been complicated by hydronephrosis. Baby is feeding by breast. Bleeding light. Bowel function is normal. Bladder function is normal. Patient is not sexually active. Contraception method is none. Postpartum depression screening: Score = 5 I have reviewed the above information and verified it. The following portions of the patient's history were reviewed and updated as appropriate: allergies, current medications, past family history, past medical history, past social history, past surgical history and problem list. Last pap smear done 09/28/2017 and was Normal  Review of Systems Pertinent items are noted in HPI.    Objective:  Blood pressure 119/80, pulse 66, weight 168 lb (76.2 kg), currently breastfeeding.  General:  alert, cooperative and appears stated age  Lungs: normal effort  Heart:  regular rate and rhythm  Abdomen: soft, non-tender; bowel sounds normal; no masses,  no organomegaly        Assessment:    Nml postpartum exam. Pap smear not done at today's visit.   Plan:   1. Contraception: condoms 2. Pap due 09/2020 3. Follow up in: 1 year or as needed.

## 2018-05-28 ENCOUNTER — Encounter: Payer: Self-pay | Admitting: Family Medicine

## 2018-11-19 ENCOUNTER — Ambulatory Visit
Admission: EM | Admit: 2018-11-19 | Discharge: 2018-11-19 | Disposition: A | Discharge status: Home / Self Care | Payer: 59 | Attending: Family Medicine | Admitting: Family Medicine

## 2018-11-19 ENCOUNTER — Encounter: Payer: Self-pay | Admitting: Emergency Medicine

## 2018-11-19 ENCOUNTER — Other Ambulatory Visit: Payer: Self-pay

## 2018-11-19 DIAGNOSIS — Z79899 Other long term (current) drug therapy: Secondary | ICD-10-CM | POA: Diagnosis not present

## 2018-11-19 DIAGNOSIS — R079 Chest pain, unspecified: Secondary | ICD-10-CM | POA: Insufficient documentation

## 2018-11-19 DIAGNOSIS — I498 Other specified cardiac arrhythmias: Secondary | ICD-10-CM | POA: Insufficient documentation

## 2018-11-19 DIAGNOSIS — Z885 Allergy status to narcotic agent status: Secondary | ICD-10-CM | POA: Insufficient documentation

## 2018-11-19 DIAGNOSIS — F419 Anxiety disorder, unspecified: Secondary | ICD-10-CM

## 2018-11-19 DIAGNOSIS — Z87891 Personal history of nicotine dependence: Secondary | ICD-10-CM | POA: Diagnosis not present

## 2018-11-19 DIAGNOSIS — R0602 Shortness of breath: Secondary | ICD-10-CM | POA: Insufficient documentation

## 2018-11-19 DIAGNOSIS — F329 Major depressive disorder, single episode, unspecified: Secondary | ICD-10-CM | POA: Diagnosis not present

## 2018-11-19 DIAGNOSIS — Z791 Long term (current) use of non-steroidal anti-inflammatories (NSAID): Secondary | ICD-10-CM | POA: Diagnosis not present

## 2018-11-19 NOTE — Discharge Instructions (Signed)
Follow up with primary care provider 

## 2018-11-19 NOTE — ED Triage Notes (Signed)
Patient c/o SOB and chest pain that started on Monday.  Patient reports that she does have anxiety and panic attacks.

## 2018-11-19 NOTE — ED Provider Notes (Signed)
[facility]    CSN: [id number] Arrival date & time: 11/19/18  0902     History   Chief Complaint Chief Complaint  Patient presents with  . Shortness of Breath    APPOINTMENT    HPI Carol [name] is a 32 y.o. female.   32 yo female with a c/o shortness of breath and chest pains for the past 5 days. States she has a h/o anxiety, has been under a lot more stress recently and was started on fluoxetine by her PCP for this. States she wanted to come in and get checked out just to see if it may be something else, however patient states she thinks it may be her anxiety. States she looks up symptoms on google and then starts worrying more.   Denies any diaphoresis, neck pain, shoulder pain, fevers, chills, cough, palpitations.   The history is provided by the patient.    Past Medical History:  Diagnosis Date  . Anxiety   . Depression    doing ok  . Migraines   . Vaginal Pap smear, abnormal    cryo therapy, ok since    Patient Active Problem List   Diagnosis Date Noted  . Abnormal gallbladder ultrasound 04/26/2018  . Heart burn 02/03/2018  . Migraines   . Chronic headache disorder 04/22/2017  . 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 Musculoskeletal disorder of neck 04/22/2017  . Osteoarthritis 04/22/2017    Past Surgical History:  Procedure Laterality Date  . CRYOTHERAPY  2012    OB History    Gravida  3   Para  2   Term  2   Preterm      AB  1   Living  2     SAB  1   TAB      Ectopic      Multiple  0   Live Births  2            Home Medications    Prior to Admission medications   Medication Sig Start Date End Date Taking? Authorizing Provider  FLUoxetine (PROZAC) 10 MG tablet Take by mouth. 11/16/18 02/14/19 Yes [provider]  Prenatal Vit-Fe Fumarate-FA (PRENATAL MULTIVITAMIN) TABS tablet Take 1 tablet by mouth daily at 12 noon.   Yes [provider]  acetaminophen (TYLENOL) 500 MG tablet Take 500 mg  by mouth every 6 (six) hours as needed for moderate pain.     [provider]  hydrocortisone (ANUSOL-HC) 25 MG suppository Place 1 suppository (25 mg total) rectally 2 (two) times daily. 05/25/18   Donnamae Jude, MD  ibuprofen (ADVIL,MOTRIN) 600 MG tablet Take 1 tablet (600 mg total) by mouth every 6 (six) hours. Patient not taking: Reported on 05/05/2018 04/16/18   Katheren Shams, DO  NIFEdipine (PROCARDIA-XL/ADALAT-CC/NIFEDICAL-XL) 30 MG 24 hr tablet Take by mouth. 04/23/18 04/23/19  [provider]    Family History Family History  Problem Relation Age of Onset  . Lung disease Mother   . Asthma Mother   . Cancer Mother        skin  . Gout Father   . Heart disease Father        a-fib  . Cancer Maternal Grandmother        breast, pancreatic  . Cancer Maternal Grandfather        colon  . Lung disease Paternal Grandfather     Social History Social History   Tobacco Use  . Smoking status: Former Research scientist (life sciences)  .  Smokeless tobacco: Never Used  . Tobacco comment: prior to first preg  Substance Use Topics  . Alcohol use: No  . Drug use: No     Allergies   Codeine   Review of Systems Review of Systems   Physical Exam Triage Vital Signs ED Triage Vitals  Enc Vitals Group     BP 11/19/18 0920 115/79     Pulse Rate 11/19/18 0920 79     Resp 11/19/18 0920 14     Temp 11/19/18 0920 98.3 F (36.8 C)     Temp Source 11/19/18 0920 Oral     SpO2 11/19/18 0920 100 %     Weight 11/19/18 0918 163 lb (73.9 kg)     Height 11/19/18 0918 5\' 7"  (1.702 m)     Head Circumference --      Peak Flow --      Pain Score 11/19/18 0917 5     Pain Loc --      Pain Edu? --      Excl. in [place]? --    No data found.  Updated Vital Signs BP 115/79 (BP Location: Left Arm)   Pulse 79   Temp 98.3 F (36.8 C) (Oral)   Resp 14   Ht 5\' 7"  (1.702 m)   Wt 73.9 kg   LMP 11/10/2018 (Exact Date)   SpO2 100%   Breastfeeding Yes   BMI 25.53 kg/m   Visual Acuity Right Eye  Distance:   Left Eye Distance:   Bilateral Distance:    Right Eye Near:   Left Eye Near:    Bilateral Near:     Physical Exam Constitutional:      General: She is not in acute distress.    Appearance: Normal appearance. She is not ill-appearing, toxic-appearing or diaphoretic.  Cardiovascular:     Rate and Rhythm: Normal rate and regular rhythm.     Pulses: Normal pulses.     Heart sounds: Normal heart sounds. No murmur. No friction rub. No gallop.   Pulmonary:     Effort: Pulmonary effort is normal. No respiratory distress.     Breath sounds: Normal breath sounds. No stridor. No wheezing, rhonchi or rales.  Chest:     Chest wall: No tenderness.  Musculoskeletal:     Right lower leg: No edema.     Left lower leg: No edema.  Neurological:     Mental Status: She is alert.      UC Treatments / Results  Labs (all labs ordered are listed, but only abnormal results are displayed) Labs Reviewed - No data to display  EKG None  Radiology No results found.  Procedures ED EKG Date/Time: 11/19/2018 3:13 PM Performed by: Norval Gable, MD Authorized by: Norval Gable, MD   ECG reviewed by ED Physician in the absence of a cardiologist: yes   Previous ECG:    Previous ECG:  Unavailable Interpretation:    Interpretation: normal   Rate:    ECG rate:  68   ECG rate assessment: normal   Rhythm:    Rhythm: sinus rhythm   Ectopy:    Ectopy: none   QRS:    QRS axis:  Normal Conduction:    Conduction: normal   ST segments:    ST segments:  Normal T waves:    T waves: normal     (including critical care time)  Medications Ordered in UC Medications - No data to display  Initial Impression / Assessment and Plan / UC  Course  I have reviewed the triage vital signs and the nursing notes.  Pertinent labs & imaging results that were available during my care of the patient were reviewed by me and considered in my medical decision making (see chart for details).       Final Clinical Impressions(s) / UC Diagnoses   Final diagnoses:  Anxiety disorder, unspecified type     Discharge Instructions     Follow up with primary care provider    ED Prescriptions    None     1. ekg results and diagnosis reviewed with patient 2. Recommend continue fluoxetine which she just started as prescribed by her pcp . Recommend supportive treatment with lifestyle behavioral changes to help with anxiety 4. Follow-up prn if symptoms worsen or don't improve   Controlled Substance Prescriptions [place] Controlled Substance Registry consulted? Not Applicable   Norval Gable, MD 11/19/18 1515

## 2019-02-25 ENCOUNTER — Telehealth: Payer: 59 | Admitting: Family

## 2019-02-25 DIAGNOSIS — Z719 Counseling, unspecified: Secondary | ICD-10-CM

## 2019-02-25 DIAGNOSIS — R0602 Shortness of breath: Secondary | ICD-10-CM

## 2019-02-25 MED ORDER — BENZONATATE 200 MG PO CAPS: 200.0000 mg | ORAL_CAPSULE | Freq: Three times a day (TID) | ORAL | 0 refills | Status: DC | PRN

## 2019-02-25 MED ORDER — ALBUTEROL SULFATE HFA 108 (90 BASE) MCG/ACT IN AERS: 2.0000 | INHALATION_SPRAY | RESPIRATORY_TRACT | 2 refills | Status: DC | PRN

## 2019-02-25 NOTE — Progress Notes (Signed)
Greater than 5 minutes, yet less than 10 minutes of time have been spent researching, coordinating, and implementing care for this patient today.  Thank you for the details you included in the comment boxes. Those details are very helpful in determining the best course of treatment for you and help [place] to provide the best care.  Fortunately, body aches are typically not part of COVID-19. Your symptoms are not consistent with this infection. See plan below.  E-Visit for Corona Virus Screening  Based on your current symptoms, it seems unlikely that your symptoms are related to the Coronavirus.   Coronavirus disease 2019 (COVID-19) is a respiratory illness that can spread from person to person. The virus that causes COVID-19 is a new virus that was first identified in the country of [place] but is now found in multiple other countries and has spread to the [place].  Symptoms associated with the virus are mild to severe fever, cough, and shortness of breath. There is currently no vaccine to protect against COVID-19, and there is no specific antiviral treatment for the virus.   To be considered HIGH RISK for Coronavirus (COVID-19), you have to meet the following criteria:  . Traveled to [place], [place], [place], [place] or [place]; or in the [place] to [place], [place], [place], or [place]; and have fever, cough, and shortness of breath within the last 2 weeks of travel OR  . Been in close contact with a person diagnosed with COVID-19 within the last 2 weeks and have fever, cough, and shortness of breath  . IF YOU DO NOT MEET THESE CRITERIA, YOU ARE CONSIDERED LOW RISK FOR COVID-19.   It is vitally important that if you feel that you have an infection such as this virus or any other virus that you stay home and away from places where you may spread it to others.  You should self-quarantine for 14 days if you have symptoms that could potentially be coronavirus and avoid contact with people  age 33 and older.   You can use medication such as A prescription cough medication called Tessalon Perles 100 mg. You may take 1-2 capsules every 8 hours as needed for cough and A prescription inhaler called Albuterol MDI 90 mcg /actuation 2 puffs every 4 hours as needed for shortness of breath, wheezing, cough  You may also take acetaminophen (Tylenol) as needed for fever.   Reduce your risk of any infection by using the same precautions used for avoiding the common cold or flu:  Marland Kitchen Wash your hands often with soap and warm water for at least 20 seconds.  If soap and water are not readily available, use an alcohol-based hand sanitizer with at least 60% alcohol.  . If coughing or sneezing, cover your mouth and nose by coughing or sneezing into the elbow areas of your shirt or coat, into a tissue or into your sleeve (not your hands). . Avoid shaking hands with others and consider head nods or verbal greetings only. . Avoid touching your eyes, nose, or mouth with unwashed hands.  . Avoid close contact with people who are sick. . Avoid places or events with large numbers of people in one location, like concerts or sporting events. . Carefully consider travel plans you have or are making. . If you are planning any travel outside or inside the [place], visit the CDC's Travelers' Health webpage for the latest health notices. . If you have some symptoms but not all symptoms, continue to  monitor at home and seek medical attention if your symptoms worsen. . If you are having a medical emergency, call 911.  HOME CARE . Only take medications as instructed by your medical team. . Drink plenty of fluids and get plenty of rest. . A steam or ultrasonic humidifier can help if you have congestion.   GET HELP RIGHT AWAY IF: . You develop worsening fever. . You become short of breath . You cough up blood. . Your symptoms become more severe MAKE SURE YOU   Understand these instructions.  Will watch your  condition.  Will get help right away if you are not doing well or get worse.  Your e-visit answers were reviewed by a board certified advanced clinical practitioner to complete your personal care plan.  Depending on the condition, your plan could have included both over the counter or prescription medications.  If there is a problem please reply once you have received a response from your provider. Your safety is important to [place].  If you have drug allergies check your prescription carefully.    You can use MyChart to ask questions about today's visit, request a non-urgent call back, or ask for a work or school excuse for 24 hours related to this e-Visit. If it has been greater than 24 hours you will need to follow up with your provider, or enter a new e-Visit to address those concerns. You will get an e-mail in the next two days asking about your experience.  I hope that your e-visit has been valuable and will speed your recovery. Thank you for using e-visits.

## 2019-02-25 NOTE — Progress Notes (Signed)
DELETE, DUPLICATE   Carol [name],  I treated you for these symptoms in an e-visit today. Please see the other visit. For body aches/pains, please use Tylenol when possible. See the other treatment plan.  You will not be charged.  Guadelupe Sabin, DNP, FNP-BC [place] E-Visit Team

## 2019-02-27 ENCOUNTER — Telehealth: Payer: 59 | Admitting: Family

## 2019-02-27 DIAGNOSIS — R682 Dry mouth, unspecified: Secondary | ICD-10-CM | POA: Diagnosis not present

## 2019-02-27 NOTE — Progress Notes (Signed)
E Visit for Rash  We are sorry that you are not feeling well. Here is how we plan to help!   Hello, It looks like your mouth is dry? Have you been staying hydrated and drinking lots of fluids? From the picture it does not look like thrush. Are you running a fever? I see you did the Evisit with flu like symptoms, are you feeling better now?   Approximately 5 minutes was spent documenting and reviewing patient's chart.    GET HELP RIGHT AWAY IF:   Symptoms don't go away after treatment.  Severe itching that persists.  If you rash spreads or swells.  If you rash begins to smell.  If it blisters and opens or develops a yellow-brown crust.  You develop a fever.  You have a sore throat.  You become short of breath.  MAKE SURE YOU:  Understand these instructions. Will watch your condition. Will get help right away if you are not doing well or get worse.  Thank you for choosing an e-visit. Your e-visit answers were reviewed by a board certified advanced clinical practitioner to complete your personal care plan. Depending upon the condition, your plan could have included both over the counter or prescription medications. Please review your pharmacy choice. Be sure that the pharmacy you have chosen is open so that you can pick up your prescription now.  If there is a problem you may message your provider in [facility] to have the prescription routed to another pharmacy. Your safety is important to [place]. If you have drug allergies check your prescription carefully.  For the next 24 hours, you can use MyChart to ask questions about today's visit, request a non-urgent call back, or ask for a work or school excuse from your e-visit provider. You will get an email in the next two days asking about your experience. I hope that your e-visit has been valuable and will speed your recovery.

## 2019-07-12 ENCOUNTER — Telehealth: Payer: Self-pay | Admitting: *Deleted

## 2019-07-12 NOTE — Telephone Encounter (Signed)
Called patient back in regards to [employer]. Discussed recommendations from Dr Ilda Basset. Pt verbalizes and understands.

## 2019-07-13 ENCOUNTER — Other Ambulatory Visit: Payer: Medicaid Other

## 2019-07-13 ENCOUNTER — Other Ambulatory Visit: Payer: Self-pay

## 2019-07-13 DIAGNOSIS — Z3201 Encounter for pregnancy test, result positive: Secondary | ICD-10-CM

## 2019-07-14 ENCOUNTER — Ambulatory Visit
Admission: RE | Admit: 2019-07-14 | Discharge: 2019-07-14 | Disposition: A | Discharge status: Home / Self Care | Payer: Managed Care, Other (non HMO) | Source: Ambulatory Visit | Attending: Obstetrics and Gynecology | Admitting: Obstetrics and Gynecology

## 2019-07-14 ENCOUNTER — Telehealth: Payer: Self-pay | Admitting: *Deleted

## 2019-07-14 ENCOUNTER — Other Ambulatory Visit: Payer: Self-pay

## 2019-07-14 ENCOUNTER — Other Ambulatory Visit: Payer: Self-pay | Admitting: *Deleted

## 2019-07-14 DIAGNOSIS — Z3201 Encounter for pregnancy test, result positive: Secondary | ICD-10-CM | POA: Diagnosis not present

## 2019-07-14 LAB — BETA HCG QUANT (REF LAB): hCG Quant: 7222 m[IU]/mL

## 2019-07-14 NOTE — Telephone Encounter (Signed)
Pt informed of lab results and Dr Ilda Basset wanting to get an ultrasound done today, will call to see when she can get in.

## 2019-07-15 ENCOUNTER — Telehealth: Payer: Self-pay | Admitting: *Deleted

## 2019-07-15 NOTE — Telephone Encounter (Signed)
Pt informed of [place] results and needing a follow up scan for viability.

## 2019-07-20 ENCOUNTER — Other Ambulatory Visit: Payer: Self-pay | Admitting: *Deleted

## 2019-07-20 MED ORDER — PROMETHAZINE HCL 25 MG PO TABS: 25.0000 mg | ORAL_TABLET | Freq: Four times a day (QID) | ORAL | 1 refills | Status: DC | PRN

## 2019-07-20 NOTE — Telephone Encounter (Signed)
Pt messaged wanting something called in for nausea. RX sent into [facility]

## 2019-07-25 ENCOUNTER — Ambulatory Visit (INDEPENDENT_AMBULATORY_CARE_PROVIDER_SITE_OTHER): Payer: Managed Care, Other (non HMO) | Admitting: *Deleted

## 2019-07-25 ENCOUNTER — Other Ambulatory Visit: Payer: Self-pay

## 2019-07-25 VITALS — BP 124/76 | HR 120

## 2019-07-25 DIAGNOSIS — O3680X0 Pregnancy with inconclusive fetal viability, not applicable or unspecified: Secondary | ICD-10-CM | POA: Diagnosis not present

## 2019-07-25 DIAGNOSIS — Z3490 Encounter for supervision of normal pregnancy, unspecified, unspecified trimester: Secondary | ICD-10-CM

## 2019-07-25 DIAGNOSIS — Z3A08 8 weeks gestation of pregnancy: Secondary | ICD-10-CM | POA: Diagnosis not present

## 2019-07-25 NOTE — Progress Notes (Signed)
DATING AND VIABILITY SONOGRAM   Carol [name] is a 33 y.o. year old [id number] with LMP Patient's last menstrual period was 06/11/2019 (exact date). which would correlate to  Unknown weeks gestation.  She has regular menstrual cycles.   She is here today for a confirmatory viability sonogram.    GESTATION: SINGLETON yes     FETAL ACTIVITY:          Heart rate    141          The fetus is current.    GESTATIONAL AGE AND  BIOMETRICS:  Gestational criteria: Estimated Date of Delivery: None noted. by LMP now at Unknown  Previous Scans:1  GESTATIONAL SAC            mm          weeks  CROWN RUMP LENGTH          0.795 mm         6.5 weeks                                                   AVERAGE EGA(BY THIS SCAN):  6.5 weeks  WORKING EDD( LMP ):  03/17/2020     TECHNICIAN COMMENTS: Patient informed that the ultrasound is considered a limited obstetric ultrasound and is not intended to be a complete ultrasound exam. Patient also informed that the ultrasound is not being completed with the intent of assessing for fetal or placental anomalies or any pelvic abnormalities. Explained that the purpose of today's ultrasound is to assess for fetal heart rate. Patient acknowledges the purpose of the exam and the limitations of the study.       A copy of this report including all images has been saved and backed up to a second source for retrieval if needed. All measures and details of the anatomical scan, placentation, fluid volume and pelvic anatomy are contained in that report.  Crosby Oyster 07/25/2019 3:48 PM

## 2019-07-25 NOTE — Progress Notes (Signed)
Pt here today for viability scan to follow up on previous ultrasound

## 2019-07-26 NOTE — Progress Notes (Signed)
Patient seen and assessed by nursing staff.  Agree with documentation and plan. I performed the TV portion of the exam, which showed a single living IUP with + flicker c/w dates.

## 2019-08-11 ENCOUNTER — Encounter: Payer: Self-pay | Admitting: *Deleted

## 2019-08-23 ENCOUNTER — Other Ambulatory Visit: Payer: Self-pay | Admitting: Obstetrics and Gynecology

## 2019-08-23 ENCOUNTER — Ambulatory Visit (INDEPENDENT_AMBULATORY_CARE_PROVIDER_SITE_OTHER): Payer: 59 | Admitting: Obstetrics and Gynecology

## 2019-08-23 ENCOUNTER — Other Ambulatory Visit: Payer: Self-pay

## 2019-08-23 ENCOUNTER — Encounter: Payer: Self-pay | Admitting: Obstetrics and Gynecology

## 2019-08-23 ENCOUNTER — Other Ambulatory Visit (HOSPITAL_COMMUNITY)
Admission: RE | Admit: 2019-08-23 | Discharge: 2019-08-23 | Disposition: A | Discharge status: Home / Self Care | Payer: 59 | Source: Ambulatory Visit | Attending: Obstetrics and Gynecology | Admitting: Obstetrics and Gynecology

## 2019-08-23 VITALS — BP 125/75 | HR 105 | Wt 175.8 lb

## 2019-08-23 DIAGNOSIS — Z3A10 10 weeks gestation of pregnancy: Secondary | ICD-10-CM

## 2019-08-23 DIAGNOSIS — O0991 Supervision of high risk pregnancy, unspecified, first trimester: Secondary | ICD-10-CM

## 2019-08-23 DIAGNOSIS — Z23 Encounter for immunization: Secondary | ICD-10-CM | POA: Diagnosis not present

## 2019-08-23 DIAGNOSIS — O0990 Supervision of high risk pregnancy, unspecified, unspecified trimester: Secondary | ICD-10-CM

## 2019-08-23 DIAGNOSIS — O1495 Unspecified pre-eclampsia, complicating the puerperium: Secondary | ICD-10-CM

## 2019-08-23 DIAGNOSIS — O3660X0 Maternal care for excessive fetal growth, unspecified trimester, not applicable or unspecified: Secondary | ICD-10-CM

## 2019-08-23 DIAGNOSIS — O26891 Other specified pregnancy related conditions, first trimester: Secondary | ICD-10-CM

## 2019-08-23 DIAGNOSIS — Z6791 Unspecified blood type, Rh negative: Secondary | ICD-10-CM

## 2019-08-23 HISTORY — DX: Unspecified pre-eclampsia, complicating the puerperium: O14.95

## 2019-08-23 MED ORDER — ASPIRIN EC 81 MG PO TBEC: 81.0000 mg | DELAYED_RELEASE_TABLET | Freq: Every day | ORAL | 2 refills | Status: DC

## 2019-08-23 NOTE — Progress Notes (Signed)
Last pap 09/28/2017- normal  Baby scripts  10 weeks 3 days

## 2019-08-23 NOTE — Progress Notes (Signed)
New OB Note  08/23/2019   Clinic: [facility]  Chief Complaint: NOB  Transfer of Care Patient: no  History of Present Illness: Ms. Dobry is a 33 y.o. [id number] @ 10/3 weeks (EDC 4/10 [tentative], based on Patient's last menstrual period was 06/11/2019 (exact date).).  Preg complicated by has Migraines; Chronic headache disorder; Cervicocranial syndrome; Musculoskeletal disorder of neck; Osteoarthritis; Rh negative state in antepartum period; Heart burn; History of macrosomia in infant in prior pregnancy, currently pregnant; Abnormal gallbladder ultrasound; Severe preeclampsia in postpartum period; and Supervision of high risk pregnancy, antepartum on their problem list.   Any events prior to today's visit: no Her periods were: qmonth, regular She has Negative signs or symptoms of nausea/vomiting of pregnancy. She has Negative signs or symptoms of miscarriage or preterm labor On any medications around the time she conceived/early pregnancy: No   ROS: A 12-point review of systems was performed and negative, except as stated in the above HPI.  OBGYN History: As per HPI. OB History  Gravida Para Term Preterm AB Living  4 2 2   1 2   SAB TAB Ectopic Multiple Live Births  1     0 2    # Outcome Date GA Lbr Len/2nd Weight Sex Delivery Anes PTL Lv  4 Current           3 Term 04/14/18 [redacted]w[redacted]d 03:09 / 03:27 8 lb 11.2 oz (3.946 kg) M Vag-Spont EPI  LIV  2 SAB 12/2016 [redacted]w[redacted]d         1 Term 05/27/14 [redacted]w[redacted]d  8 lb 10 oz (3.912 kg) F Vag-Forceps EPI  LIV     Birth Comments: pushed 4 hours, forceps used for delivery    Any issues with any prior pregnancies: 1wk pp severe pre-x Prior children are healthy, doing well, and without any problems or issues: yes History of pap smears: Yes. Last pap smear 2018 and results were negative   Past Medical History: Past Medical History:  Diagnosis Date  . Anxiety   . Depression    doing ok  . Migraines   . Vaginal Pap smear,  abnormal    cryo therapy, ok since    Past Surgical History: Past Surgical History:  Procedure Laterality Date  . CRYOTHERAPY  2012    Family History:  Family History  Problem Relation Age of Onset  . Lung disease Mother   . Asthma Mother   . Cancer Mother        skin  . Gout Father   . Heart disease Father        a-fib  . Cancer Maternal Grandmother        breast, pancreatic  . Cancer Maternal Grandfather        colon  . Lung disease Paternal Grandfather     Social History:  Social History   Socioeconomic History  . Marital status: Married    Spouse name: Not on file  . Number of children: 2  . Years of education: Not on file  . Highest education level: Not on file  Occupational History  . Not on file  Social Needs  . Financial resource strain: Not on file  . Food insecurity    Worry: Not on file    Inability: Not on file  . Transportation needs    Medical: Not on file    Non-medical: Not on file  Tobacco Use  . Smoking status: Former Research scientist (life sciences)  . Smokeless tobacco: Never Used  .  Tobacco comment: prior to first preg  Substance and Sexual Activity  . Alcohol use: No  . Drug use: No  . Sexual activity: Yes    Birth control/protection: None  Lifestyle  . Physical activity    Days per week: Not on file    Minutes per session: Not on file  . Stress: Not on file  Relationships  . Social Herbalist on phone: Not on file    Gets together: Not on file    Attends religious service: Not on file    Active member of club or organization: Not on file    Attends meetings of clubs or organizations: Not on file    Relationship status: Not on file  . Intimate partner violence    Fear of current or ex partner: Not on file    Emotionally abused: Not on file    Physically abused: Not on file    Forced sexual activity: Not on file  Other Topics Concern  . Not on file  Social History Narrative  . Not on file    Allergy: Allergies  Allergen Reactions   . Codeine Nausea And Vomiting    Health Maintenance:  Mammogram Up to Date: not applicable  Current Outpatient Medications: PNV  Physical Exam:   BP 125/75   Pulse (!) 105   Wt 175 lb 12.8 oz (79.7 kg)   LMP 06/11/2019 (Exact Date)   BMI 27.53 kg/m  Body mass index is 27.53 kg/m. Contractions: Not present Vag. Bleeding: None. Fundal height: not applicable FHTs: [id number]  General appearance: Well nourished, well developed female in no acute distress.  Neck:  Supple, normal appearance, and no thyromegaly  Cardiovascular: S1, S2 normal, no murmur, rub or gallop, regular rate and rhythm Respiratory:  Clear to auscultation bilateral. Normal respiratory effort Abdomen: positive bowel sounds and no masses, hernias; diffusely non tender to palpation, non distended Breasts: patient denies any breast s/s Neuro/Psych:  Normal mood and affect.  Skin:  Warm and dry.  Lymphatic:  No inguinal lymphadenopathy.   Pelvic exam: is not limited by body habitus EGBUS: within normal limits, Vagina: within normal limits and with no blood in the vault, Cervix: normal appearing cervix without discharge or lesions, closed/long/high, Uterus:  enlarged, c/w 10 week size, and Adnexa:  normal adnexa and no mass, fullness, tenderness  Laboratory: none  Imaging:  none  Assessment: pt doing well  Plan: 1. Supervision of high risk pregnancy, antepartum Routine care Okay for genetics Babyscripts patient Offer afp nv Will set up for anatomy u/s - Obstetric Panel, Including HIV - Babyscripts Schedule Optimization - Genetic Screening - US MFM OB COMP + 14 WK; Future - Culture, OB Urine - GC/Chlamydia probe amp ([place])not at [facility] - Comprehensive metabolic panel - Protein / creatinine ratio, urine - Hemoglobin A1c  2. Excessive fetal growth affecting management of pregnancy, antepartum, single or unspecified fetus No issues with delivery, had normal GTT Early a1c today  3. Preeclampsia  in postpartum period Baseline labs. Start low dose asa in 3wks  4. Rh negative state in antepartum period Rhogam prn Follow up Ab screen  Problem list reviewed and updated.  Follow up in 8 weeks.  The nature of [place] with multiple MDs and other Advanced Practice Providers was explained to patient; also emphasized that residents, students are part of our team.  >50% of 20 min visit spent on counseling and coordination of care.  Durene Romans MD Attending Center for [facility])

## 2019-08-24 LAB — CERVICOVAGINAL ANCILLARY ONLY
Chlamydia: NEGATIVE
Neisseria Gonorrhea: NEGATIVE

## 2019-08-25 LAB — COMPREHENSIVE METABOLIC PANEL
ALT: 13 IU/L (ref 0–32)
AST: 13 IU/L (ref 0–40)
Albumin/Globulin Ratio: 2.1 (ref 1.2–2.2)
Albumin: 4.4 g/dL (ref 3.8–4.8)
Alkaline Phosphatase: 56 IU/L (ref 39–117)
BUN/Creatinine Ratio: 15 (ref 9–23)
BUN: 8 mg/dL (ref 6–20)
Bilirubin Total: 0.3 mg/dL (ref 0.0–1.2)
CO2: 20 mmol/L (ref 20–29)
Calcium: 9.8 mg/dL (ref 8.7–10.2)
Chloride: 104 mmol/L (ref 96–106)
Creatinine, Ser: 0.55 mg/dL — ABNORMAL LOW (ref 0.57–1.00)
GFR calc Af Amer: 143 mL/min/{1.73_m2} (ref >59)
GFR calc non Af Amer: 124 mL/min/{1.73_m2} (ref >59)
Globulin, Total: 2.1 g/dL (ref 1.5–4.5)
Glucose: 92 mg/dL (ref 65–99)
Potassium: 4.1 mmol/L (ref 3.5–5.2)
Sodium: 138 mmol/L (ref 134–144)
Total Protein: 6.5 g/dL (ref 6.0–8.5)

## 2019-08-25 LAB — OBSTETRIC PANEL, INCLUDING HIV
Antibody Screen: NEGATIVE
Basophils Absolute: 0 10*3/uL (ref 0.0–0.2)
Basos: 0 %
EOS (ABSOLUTE): 0.1 10*3/uL (ref 0.0–0.4)
Eos: 1 %
HIV Screen 4th Generation wRfx: NONREACTIVE
Hematocrit: 38.6 % (ref 34.0–46.6)
Hemoglobin: 13.1 g/dL (ref 11.1–15.9)
Hepatitis B Surface Ag: NEGATIVE
Immature Grans (Abs): 0 10*3/uL (ref 0.0–0.1)
Immature Granulocytes: 0 %
Lymphocytes Absolute: 1.7 10*3/uL (ref 0.7–3.1)
Lymphs: 17 %
MCH: 30.3 pg (ref 26.6–33.0)
MCHC: 33.9 g/dL (ref 31.5–35.7)
MCV: 89 fL (ref 79–97)
Monocytes Absolute: 0.5 10*3/uL (ref 0.1–0.9)
Monocytes: 5 %
Neutrophils Absolute: 7.5 10*3/uL — ABNORMAL HIGH (ref 1.4–7.0)
Neutrophils: 77 %
Platelets: 349 10*3/uL (ref 150–450)
RBC: 4.33 x10E6/uL (ref 3.77–5.28)
RDW: 12.3 % (ref 11.7–15.4)
RPR Ser Ql: NONREACTIVE
Rh Factor: NEGATIVE
Rubella Antibodies, IGG: 1.69 index (ref >0.99)
WBC: 9.7 10*3/uL (ref 3.4–10.8)

## 2019-08-25 LAB — PROTEIN / CREATININE RATIO, URINE
Creatinine, Urine: 9.9 mg/dL
Protein, Ur: 4.2 mg/dL
Protein/Creat Ratio: 424 mg/g creat — ABNORMAL HIGH (ref 0–200)

## 2019-08-25 LAB — HEMOGLOBIN A1C
Est. average glucose Bld gHb Est-mCnc: 94 mg/dL
Hgb A1c MFr Bld: 4.9 % (ref 4.8–5.6)

## 2019-08-25 LAB — CULTURE, OB URINE

## 2019-08-25 LAB — URINE CULTURE, OB REFLEX

## 2019-08-26 ENCOUNTER — Telehealth: Payer: Self-pay | Admitting: *Deleted

## 2019-08-26 ENCOUNTER — Other Ambulatory Visit: Payer: Self-pay | Admitting: *Deleted

## 2019-08-26 ENCOUNTER — Encounter: Payer: Self-pay | Admitting: Obstetrics and Gynecology

## 2019-08-26 DIAGNOSIS — O1210 Gestational proteinuria, unspecified trimester: Secondary | ICD-10-CM | POA: Insufficient documentation

## 2019-08-26 DIAGNOSIS — O0990 Supervision of high risk pregnancy, unspecified, unspecified trimester: Secondary | ICD-10-CM

## 2019-08-26 NOTE — Telephone Encounter (Signed)
Spoke with pt about results and Dr Ilda Basset wanting to do a 24hr urine. Pt will pick up supplies today and start collection on Sunday. Reviewed instructions with pt, pt verbalizes and understands.

## 2019-08-29 ENCOUNTER — Other Ambulatory Visit: Payer: 59

## 2019-08-29 ENCOUNTER — Other Ambulatory Visit: Payer: Self-pay

## 2019-08-29 DIAGNOSIS — O0990 Supervision of high risk pregnancy, unspecified, unspecified trimester: Secondary | ICD-10-CM

## 2019-08-30 ENCOUNTER — Other Ambulatory Visit: Payer: Self-pay | Admitting: *Deleted

## 2019-08-30 ENCOUNTER — Encounter: Payer: Self-pay | Admitting: *Deleted

## 2019-08-30 DIAGNOSIS — O0990 Supervision of high risk pregnancy, unspecified, unspecified trimester: Secondary | ICD-10-CM

## 2019-08-30 LAB — PROTEIN, URINE, 24 HOUR
Protein, 24H Urine: 168 mg/24 hr — ABNORMAL HIGH (ref 30–150)
Protein, Ur: 7 mg/dL

## 2019-08-30 LAB — CREATININE CLEARANCE, URINE, 24 HOUR
Creatinine Clearance: 160 mL/min — ABNORMAL HIGH (ref 88–128)
Creatinine, 24H Ur: 1219 mg/24 hr (ref 800–1800)
Creatinine, Ser: 0.53 mg/dL — ABNORMAL LOW (ref 0.57–1.00)
Creatinine, Urine: 50.8 mg/dL
GFR calc Af Amer: 144 mL/min/{1.73_m2} (ref >59)
GFR calc non Af Amer: 125 mL/min/{1.73_m2} (ref >59)

## 2019-09-05 ENCOUNTER — Telehealth: Payer: Self-pay | Admitting: Radiology

## 2019-09-05 ENCOUNTER — Encounter: Payer: Self-pay | Admitting: Radiology

## 2019-09-05 NOTE — Telephone Encounter (Signed)
Left message for patient with [facility] genetic screening results

## 2019-09-14 ENCOUNTER — Encounter: Payer: Self-pay | Admitting: Radiology

## 2019-09-14 ENCOUNTER — Telehealth: Payer: Self-pay | Admitting: Radiology

## 2019-09-14 NOTE — Telephone Encounter (Signed)
Informed patient on Panorama results, patient does not want to know the sex of the baby.

## 2019-09-23 ENCOUNTER — Telehealth: Payer: Self-pay | Admitting: General Practice

## 2019-09-23 NOTE — Telephone Encounter (Signed)
Yes, please schedule her for a new patient appointment virtually as early as Monday next week. 9:40 am slot preferred.

## 2019-09-23 NOTE — Telephone Encounter (Signed)
Called to r/s for sooner appt. Lvm asking patient to call office.

## 2019-09-23 NOTE — Telephone Encounter (Signed)
Patient called today to schedule a new patient visit./ She stated that she has recurring sinus infections.but because she is [redacted] weeks pregnant her pcp office she is currently at will not bring her in or even see her.   Patient is scheduled for the 26th for a video visit and was advised that we were not able to physically bring her in. Is there anything sooner you could work her in since I know that is a couple weeks out or should be patient be seen at a [facility] before the appointment if needed

## 2019-09-26 ENCOUNTER — Encounter: Payer: Self-pay | Admitting: Primary Care

## 2019-09-26 ENCOUNTER — Ambulatory Visit (INDEPENDENT_AMBULATORY_CARE_PROVIDER_SITE_OTHER): Payer: 59 | Admitting: Primary Care

## 2019-09-26 DIAGNOSIS — J309 Allergic rhinitis, unspecified: Secondary | ICD-10-CM

## 2019-09-26 DIAGNOSIS — G43701 Chronic migraine without aura, not intractable, with status migrainosus: Secondary | ICD-10-CM | POA: Diagnosis not present

## 2019-09-26 DIAGNOSIS — G8929 Other chronic pain: Secondary | ICD-10-CM

## 2019-09-26 DIAGNOSIS — F411 Generalized anxiety disorder: Secondary | ICD-10-CM | POA: Diagnosis not present

## 2019-09-26 DIAGNOSIS — M530 Cervicocranial syndrome: Secondary | ICD-10-CM

## 2019-09-26 DIAGNOSIS — R519 Headache, unspecified: Secondary | ICD-10-CM | POA: Diagnosis not present

## 2019-09-26 NOTE — Assessment & Plan Note (Signed)
Daily headaches, off all meds for migraines. Consider neurology evaluation if needed.

## 2019-09-26 NOTE — Patient Instructions (Signed)
Continue Flonase daily as needed.  Switch Claritin for Zyrtec as discussed.  You will be contacted regarding your referral to the allergist.  Please let us know if you have not been contacted within one week.   Please don't hesitate to message me via My Chart with any questions/updates.  It was a pleasure to meet you today! Please don't hesitate to call or message me with any questions. Welcome to [facility]!

## 2019-09-26 NOTE — Progress Notes (Signed)
Subjective:    Patient ID: Carol [name], female    DOB: August 29, 1986, 33 y.o.   MRN: [id number]  HPI  Virtual Visit via Video Note  I connected with Carol [name] on 09/26/19 at  9:40 AM EDT by a video enabled telemedicine application and verified that I am speaking with the correct person using two identifiers.  Location: Patient: Home Provider: OFFICE   I discussed the limitations of evaluation and management by telemedicine and the availability of in person appointments. The patient expressed understanding and agreed to proceed.  History of Present Illness:  Carol [name] is a 33 year old female who presents today to establish care and discuss the problems mentioned below. Will obtain/review records.  1) Migraines/Chronic headaches: Chronic for years. Previously following with neurology when she lived in [place]. She was managed on Ibuprofen, Maxalt PRN, ketorolac injection, Cambia, Baclofen (chronic neck/posterior headache), and Sonata for sleep. She continues to struggle with migraines once monthly, headaches daily. She has been off of her medications since the birth of her son one year ago. She is now [redacted] weeks pregnant.   2) Nasal Congestion: Acute for the last 2 months. Also with rhinorrhea, congested cough, sinus tenderness, decreased sense of smell, sinus headaches. She's tested negative for Covid-19 in late September.   She was seen in a minute clinic in early October, was prescribed Amoxicillin 500 mg BID for a 10 day course. She completed five days ago. She presented to the [facility] ED on 09/19/19 for same symptoms as she couldn't get in with her PCP, they didn't recommend repeat Covid testing and told her to continue Flonase and Claritin.  She's been taking Claritin and Flonase daily.   Since then she continues to experience a congested cough with nasal congestion. She is [redacted] weeks pregnant. She denies fevers. Her children were tested for Covid last week, all were  negative.  3) GAD: Chronic for the last one year, family and personal stress. She is currently managed on fluoxetine 10 mg and is overall doing well. Previously managed on clonazepam for which she stopped taking after becoming pregnant. Overall she feels well managed.    Observations/Objective:  Alert and oriented. Appears well, not sickly. No distress. Speaking in complete sentences. Congested cough noted.  Assessment and Plan:  See problem based charting.  Follow Up Instructions:  Continue Flonase daily as needed.  Switch Claritin for Zyrtec as discussed.  You will be contacted regarding your referral to the allergist.  Please let us know if you have not been contacted within one week.   Please don't hesitate to message me via My Chart with any questions/updates.  It was a pleasure to meet you today! Please don't hesitate to call or message me with any questions. Welcome to [facility]!     I discussed the assessment and treatment plan with the patient. The patient was provided an opportunity to ask questions and all were answered. The patient agreed with the plan and demonstrated an understanding of the instructions.   The patient was advised to call back or seek an in-person evaluation if the symptoms worsen or if the condition fails to improve as anticipated.     Carol Koch, NP    Review of Systems  Constitutional: Negative for chills, fatigue and fever.  HENT: Positive for congestion and sinus pressure. Negative for postnasal drip and sore throat.   Respiratory: Positive for cough.   Cardiovascular: Negative for chest pain.  Neurological: Positive for headaches.  Psychiatric/Behavioral:  See HPI       Past Medical History:  Diagnosis Date  . Anxiety   . Depression    doing ok  . Migraines   . Vaginal Pap smear, abnormal    cryo therapy, ok since     Social History   Socioeconomic History  . Marital status: Married    Spouse name: Not  on file  . Number of children: 2  . Years of education: Not on file  . Highest education level: Not on file  Occupational History  . Not on file  Social Needs  . Financial resource strain: Not on file  . Food insecurity    Worry: Not on file    Inability: Not on file  . Transportation needs    Medical: Not on file    Non-medical: Not on file  Tobacco Use  . Smoking status: Former Research scientist (life sciences)  . Smokeless tobacco: Never Used  . Tobacco comment: prior to first preg  Substance and Sexual Activity  . Alcohol use: No  . Drug use: No  . Sexual activity: Yes    Birth control/protection: None  Lifestyle  . Physical activity    Days per week: Not on file    Minutes per session: Not on file  . Stress: Not on file  Relationships  . Social Herbalist on phone: Not on file    Gets together: Not on file    Attends religious service: Not on file    Active member of club or organization: Not on file    Attends meetings of clubs or organizations: Not on file    Relationship status: Not on file  . Intimate partner violence    Fear of current or ex partner: Not on file    Emotionally abused: Not on file    Physically abused: Not on file    Forced sexual activity: Not on file  Other Topics Concern  . Not on file  Social History Narrative  . Not on file    Past Surgical History:  Procedure Laterality Date  . CRYOTHERAPY  2012    Family History  Problem Relation Age of Onset  . Lung disease Mother   . Asthma Mother   . Cancer Mother        skin  . Gout Father   . Heart disease Father        a-fib  . Cancer Maternal Grandmother        breast, pancreatic  . Cancer Maternal Grandfather        colon  . Lung disease Paternal Grandfather     Allergies  Allergen Reactions  . Codeine Nausea And Vomiting    Current Outpatient Medications on File Prior to Visit  Medication Sig Dispense Refill  . acetaminophen (TYLENOL) 500 MG tablet Take 500 mg by mouth every 6 (six)  hours as needed for moderate pain.     Marland Kitchen aspirin EC 81 MG tablet Take 1 tablet (81 mg total) by mouth daily. 60 tablet 2  . FLUoxetine (PROZAC) 10 MG tablet Take 10 mg by mouth.     . Prenatal Vit-Fe Fumarate-FA (PRENATAL MULTIVITAMIN) TABS tablet Take 1 tablet by mouth daily at 12 noon.    . promethazine (PHENERGAN) 25 MG tablet Take 1 tablet (25 mg total) by mouth every 6 (six) hours as needed for nausea or vomiting. 30 tablet 1   No current facility-administered medications on file prior to visit.     BP  103/69   Temp 98 F (36.7 C) (Oral)   Wt 175 lb (79.4 kg)   LMP 06/11/2019 (Exact Date)   BMI 27.41 kg/m  ' Objective:   Physical Exam  Constitutional: She is oriented to person, place, and time. She appears well-nourished.  Respiratory: Effort normal.  Congested cough during exam  Neurological: She is alert and oriented to person, place, and time.  Psychiatric: She has a normal mood and affect.           Assessment & Plan:

## 2019-09-26 NOTE — Assessment & Plan Note (Signed)
Acute for the last 2 months, little to no improvement with antibiotic and OTC treatment.  Switch from Claritin to Zyrtec. Continue Flonase. Referral placed to allergist for evaluation.

## 2019-09-26 NOTE — Assessment & Plan Note (Signed)
Doing well on fluoxetine 10 mg as she's been taking for one year. Continue same. GYN is aware of use.

## 2019-09-26 NOTE — Assessment & Plan Note (Signed)
Neck pain leading to migraines. Previously managed on Baclofen. Continue to monitor for migraines/headaches. Consider neurology evaluation given pregnancy.

## 2019-09-26 NOTE — Assessment & Plan Note (Signed)
Chronic and once monthly on average. Once managed on numerous abortive medications and following with neurology. Now off all treatment due to pregnancy.  Continue to monitor. Consider neurology evaluation if needed given pregnancy.

## 2019-10-03 ENCOUNTER — Ambulatory Visit: Payer: 59 | Admitting: Primary Care

## 2019-10-18 ENCOUNTER — Other Ambulatory Visit: Payer: Self-pay

## 2019-10-18 ENCOUNTER — Telehealth (INDEPENDENT_AMBULATORY_CARE_PROVIDER_SITE_OTHER): Payer: 59 | Admitting: Obstetrics & Gynecology

## 2019-10-18 ENCOUNTER — Encounter: Payer: Self-pay | Admitting: Obstetrics & Gynecology

## 2019-10-18 VITALS — BP 108/66

## 2019-10-18 DIAGNOSIS — O0992 Supervision of high risk pregnancy, unspecified, second trimester: Secondary | ICD-10-CM

## 2019-10-18 DIAGNOSIS — Z3A18 18 weeks gestation of pregnancy: Secondary | ICD-10-CM

## 2019-10-18 DIAGNOSIS — O0990 Supervision of high risk pregnancy, unspecified, unspecified trimester: Secondary | ICD-10-CM

## 2019-10-18 NOTE — Progress Notes (Signed)
I connected with  Para March on 10/18/19 at  8:15 AM EST by telephone and verified that I am speaking with the correct person using two identifiers.   I discussed the limitations, risks, security and privacy concerns of performing an evaluation and management service by telephone and the availability of in person appointments. I also discussed with the patient that there may be a patient responsible charge related to this service. The patient expressed understanding and agreed to proceed.  Crosby Oyster, RN 10/18/2019  8:10 AM

## 2019-10-18 NOTE — Patient Instructions (Signed)
Return to office for any scheduled appointments. Call the office or go to the MAU at [facility] if:  You begin to have strong, frequent contractions  Your water breaks.  Sometimes it is a big gush of fluid, sometimes it is just a trickle that keeps getting your panties wet or running down your legs  You have vaginal bleeding.  It is normal to have a small amount of spotting if your cervix was checked.   You do not feel your baby moving like normal.  If you do not, get something to eat and drink and lay down and focus on feeling your baby move.   If your baby is still not moving like normal, you should call the office or go to MAU.  Any other obstetric concerns.   

## 2019-10-18 NOTE — Progress Notes (Signed)
   TELEHEALTH OBSTETRICS PRENATAL VIRTUAL VIDEO VISIT ENCOUNTER NOTE  Provider location: [facility] for [place] at [facility]   I connected with Para March on 10/18/19 at  8:15 AM EST by MyChart Video Encounter at home and verified that I am speaking with the correct person using two identifiers.   I discussed the limitations, risks, security and privacy concerns of performing an evaluation and management service virtually and the availability of in person appointments. I also discussed with the patient that there may be a patient responsible charge related to this service. The patient expressed understanding and agreed to proceed. Subjective:  Carol [name] is a 33 y.o. 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  She is currently monitored for the following issues for this high-risk pregnancy and has Migraines; Chronic headache disorder; Cervicocranial syndrome; Osteoarthritis; Rh negative state in antepartum period; Heart burn; History of macrosomia in infant in prior pregnancy, currently pregnant; Abnormal gallbladder ultrasound; Severe preeclampsia in postpartum period; Supervision of high risk pregnancy, antepartum; GAD (generalized anxiety disorder); and Allergic rhinosinusitis on their problem list.  Patient reports no complaints.  Contractions: Not present. Vag. Bleeding: None.   . Denies any leaking of fluid.   The following portions of the patient's history were reviewed and updated as appropriate: allergies, current medications, past family history, past medical history, past social history, past surgical history and problem list.   Objective:   Vitals:   10/18/19 0808  BP: 108/66    Fetal Status:           General:  Alert, oriented and cooperative. Patient is in no acute distress.  Respiratory: Normal respiratory effort, no problems with respiration noted  Mental Status: Normal mood and affect. Normal behavior. Normal judgment and  thought content.  Rest of physical exam deferred due to type of encounter  Imaging: No results found.  Assessment and Plan:  Pregnancy: [id number] at [redacted]w[redacted]d 1. Supervision of high risk pregnancy, antepartum Scheduled for anatomy scan on 10/24/19. Low risk NIPS.  Counseled about AFP only screen, she desires this. This was ordered, will also get this done on 11/16. Normal BP, no other concerns.  - AFP only (15.0-22.6); Future No other complaints or concerns.  Routine obstetric precautions reviewed.  I discussed the assessment and treatment plan with the patient. The patient was provided an opportunity to ask questions and all were answered. The patient agreed with the plan and demonstrated an understanding of the instructions. The patient was advised to call back or seek an in-person office evaluation/go to MAU at [facility] for any urgent or concerning symptoms. Please refer to After Visit Summary for other counseling recommendations.   I provided 10 minutes of face-to-face time during this encounter.  Return in about 6 days (around 10/24/2019) for Lab appointment at [facility] (before or after ultrasound)//4 weeks: Virtual OB Visit.  Future Appointments  Date Time Provider [place]  10/24/2019 10:15 AM [facility]-MFC [place]    Verita Schneiders, [place] for [facility], [place]

## 2019-10-24 ENCOUNTER — Other Ambulatory Visit: Payer: 59

## 2019-10-24 ENCOUNTER — Other Ambulatory Visit (HOSPITAL_COMMUNITY): Payer: Self-pay | Admitting: *Deleted

## 2019-10-24 ENCOUNTER — Other Ambulatory Visit: Payer: Self-pay | Admitting: Obstetrics and Gynecology

## 2019-10-24 ENCOUNTER — Ambulatory Visit (HOSPITAL_COMMUNITY)
Admission: RE | Admit: 2019-10-24 | Discharge: 2019-10-24 | Disposition: A | Discharge status: Home / Self Care | Payer: 59 | Source: Ambulatory Visit | Attending: Obstetrics and Gynecology | Admitting: Obstetrics and Gynecology

## 2019-10-24 ENCOUNTER — Other Ambulatory Visit: Payer: Self-pay

## 2019-10-24 DIAGNOSIS — O360190 Maternal care for anti-D [Rh] antibodies, unspecified trimester, not applicable or unspecified: Secondary | ICD-10-CM | POA: Diagnosis not present

## 2019-10-24 DIAGNOSIS — O359XX0 Maternal care for (suspected) fetal abnormality and damage, unspecified, not applicable or unspecified: Secondary | ICD-10-CM

## 2019-10-24 DIAGNOSIS — O0990 Supervision of high risk pregnancy, unspecified, unspecified trimester: Secondary | ICD-10-CM

## 2019-10-24 DIAGNOSIS — Z3689 Encounter for other specified antenatal screening: Secondary | ICD-10-CM | POA: Insufficient documentation

## 2019-10-24 DIAGNOSIS — O368320 Maternal care for abnormalities of the fetal heart rate or rhythm, second trimester, not applicable or unspecified: Secondary | ICD-10-CM

## 2019-10-24 DIAGNOSIS — Z3A19 19 weeks gestation of pregnancy: Secondary | ICD-10-CM

## 2019-10-24 DIAGNOSIS — O09292 Supervision of pregnancy with other poor reproductive or obstetric history, second trimester: Secondary | ICD-10-CM

## 2019-10-24 DIAGNOSIS — Z3A01 Less than 8 weeks gestation of pregnancy: Secondary | ICD-10-CM | POA: Insufficient documentation

## 2019-10-26 LAB — AFP, SERUM, OPEN SPINA BIFIDA
AFP MoM: 1.14
AFP Value: 51.3 ng/mL
Gest. Age on Collection Date: 19.3 weeks
Maternal Age At EDD: 33.8 yr
OSBR Risk 1 IN: 7850
Test Results:: NEGATIVE
Weight: 186 [lb_av]

## 2019-11-01 ENCOUNTER — Encounter: Payer: Self-pay | Admitting: Obstetrics and Gynecology

## 2019-11-01 DIAGNOSIS — IMO0002 Reserved for concepts with insufficient information to code with codable children: Secondary | ICD-10-CM | POA: Insufficient documentation

## 2019-11-01 DIAGNOSIS — O350XX0 Maternal care for (suspected) central nervous system malformation in fetus, not applicable or unspecified: Secondary | ICD-10-CM | POA: Insufficient documentation

## 2019-11-01 DIAGNOSIS — O3660X0 Maternal care for excessive fetal growth, unspecified trimester, not applicable or unspecified: Secondary | ICD-10-CM | POA: Insufficient documentation

## 2019-11-01 DIAGNOSIS — O368390 Maternal care for abnormalities of the fetal heart rate or rhythm, unspecified trimester, not applicable or unspecified: Secondary | ICD-10-CM | POA: Insufficient documentation

## 2019-11-01 HISTORY — DX: Maternal care for excessive fetal growth, unspecified trimester, not applicable or unspecified: O36.60X0

## 2019-11-15 ENCOUNTER — Other Ambulatory Visit: Payer: Self-pay

## 2019-11-15 ENCOUNTER — Other Ambulatory Visit: Payer: Self-pay | Admitting: Obstetrics and Gynecology

## 2019-11-15 ENCOUNTER — Encounter: Payer: Self-pay | Admitting: Obstetrics & Gynecology

## 2019-11-15 ENCOUNTER — Telehealth (INDEPENDENT_AMBULATORY_CARE_PROVIDER_SITE_OTHER): Payer: 59 | Admitting: Obstetrics and Gynecology

## 2019-11-15 VITALS — BP 100/60 | Wt 182.0 lb

## 2019-11-15 DIAGNOSIS — O26892 Other specified pregnancy related conditions, second trimester: Secondary | ICD-10-CM

## 2019-11-15 DIAGNOSIS — R12 Heartburn: Secondary | ICD-10-CM

## 2019-11-15 DIAGNOSIS — Z6791 Unspecified blood type, Rh negative: Secondary | ICD-10-CM

## 2019-11-15 DIAGNOSIS — Z3A22 22 weeks gestation of pregnancy: Secondary | ICD-10-CM

## 2019-11-15 DIAGNOSIS — O368321 Maternal care for abnormalities of the fetal heart rate or rhythm, second trimester, fetus 1: Secondary | ICD-10-CM

## 2019-11-15 DIAGNOSIS — O0992 Supervision of high risk pregnancy, unspecified, second trimester: Secondary | ICD-10-CM

## 2019-11-15 DIAGNOSIS — O350XX0 Maternal care for (suspected) central nervous system malformation in fetus, not applicable or unspecified: Secondary | ICD-10-CM

## 2019-11-15 DIAGNOSIS — O1495 Unspecified pre-eclampsia, complicating the puerperium: Secondary | ICD-10-CM

## 2019-11-15 DIAGNOSIS — O0990 Supervision of high risk pregnancy, unspecified, unspecified trimester: Secondary | ICD-10-CM

## 2019-11-15 DIAGNOSIS — IMO0002 Reserved for concepts with insufficient information to code with codable children: Secondary | ICD-10-CM

## 2019-11-15 DIAGNOSIS — O368390 Maternal care for abnormalities of the fetal heart rate or rhythm, unspecified trimester, not applicable or unspecified: Secondary | ICD-10-CM

## 2019-11-15 MED ORDER — OMEPRAZOLE 20 MG PO CPDR: 20.0000 mg | DELAYED_RELEASE_CAPSULE | Freq: Two times a day (BID) | ORAL | 3 refills | Status: AC

## 2019-11-15 NOTE — Progress Notes (Signed)
I connected with  Para March on 11/15/19 at  9:15 AM EST by telephone and verified that I am speaking with the correct person using two identifiers.   I discussed the limitations, risks, security and privacy concerns of performing an evaluation and management service by telephone and the availability of in person appointments. I also discussed with the patient that there may be a patient responsible charge related to this service. The patient expressed understanding and agreed to proceed.  Crosby Oyster, RN   11/15/2019  9:18 AM

## 2019-11-15 NOTE — Progress Notes (Signed)
TELEHEALTH OBSTETRICS PRENATAL VIRTUAL VIDEO VISIT ENCOUNTER NOTE  Provider location: Center for [place] at [facility]   I connected with Carol [name] on 11/15/19 at  9:15 AM EST by MyChart Video Encounter at home and verified that I am speaking with the correct person using two identifiers.   I discussed the limitations, risks, security and privacy concerns of performing an evaluation and management service virtually and the availability of in person appointments. I also discussed with the patient that there may be a patient responsible charge related to this service. The patient expressed understanding and agreed to proceed. Subjective:  Carol [name] is a 33 y.o. [id number] at [redacted]w[redacted]d being seen today for ongoing prenatal care.  She is currently monitored for the following issues for this low-risk pregnancy and has Migraines; Chronic headache disorder; Cervicocranial syndrome; Osteoarthritis; Rh negative state in antepartum period; Heart burn; History of macrosomia in infant in prior pregnancy, currently pregnant; Abnormal gallbladder ultrasound; Severe preeclampsia in postpartum period; Supervision of high risk pregnancy, antepartum; GAD (generalized anxiety disorder); Allergic rhinosinusitis; LGA (large for gestational age) fetus affecting management of mother; Ventriculomegaly of brain on fetal ultrasound; and Fetal arrhythmia affecting pregnancy, antepartum on their problem list.  Patient reports heartburn.  Contractions: Not present. Vag. Bleeding: None.  Movement: Present. Denies any leaking of fluid.   The following portions of the patient's history were reviewed and updated as appropriate: allergies, current medications, past family history, past medical history, past social history, past surgical history and problem list.   Objective:   Vitals:   11/15/19 0917  BP: 100/60  Weight: 182 lb (82.6 kg)    Fetal Status:     Movement: Present     General:  Alert,  oriented and cooperative. Patient is in no acute distress.  Respiratory: Normal respiratory effort, no problems with respiration noted  Mental Status: Normal mood and affect. Normal behavior. Normal judgment and thought content.  Rest of physical exam deferred due to type of encounter  Imaging: [place] Mfm Ob Detail +14 Wk  Result Date: 10/24/2019 ----------------------------------------------------------------------  OBSTETRICS REPORT                       (Signed Final 10/24/2019 01:34 pm) ---------------------------------------------------------------------- Patient Info  ID #:       [id number]                          D.O.B.:  11/08/1986 (33 yrs)  Name:       Carol [name]             Visit Date: 10/24/2019 11:28 am ---------------------------------------------------------------------- Performed By  Performed By:     Rodrigo Ran BS      Ref. Address:     [address]                                                             [address]  Attending:        Tama High MD        [facility]  Referred By:      [facility] ---------------------------------------------------------------------- Orders   #  Description                          Code         Ordered By   1  [place] MFM OB DETAIL +14 [place]              [id number]     Aletha Halim  ----------------------------------------------------------------------   #  Order #                    Accession #                 Episode #   1  [id number]                  [id number]                  [id number]  ---------------------------------------------------------------------- Indications   Antenatal screening for malformations (low     Z36.3   risk NIPS, neg [place])   Fetal abnormality - other known or             O35.9XX0   suspected (ventriculomegaly)   Fetal arrhythmia affecting pregnancy,          O36.8390   antepartum   Rh negative state  in antepartum                O36.0190   Poor obstetric history: Prior fetal            O09.299   macrosomia, prior preeclampsia (ASA)   [redacted] weeks gestation of pregnancy                Z3A.19  ---------------------------------------------------------------------- Fetal Evaluation  Num Of Fetuses:         1  Fetal Heart Rate(bpm):  133  Cardiac Activity:       Arrhythmia noted  Presentation:           Variable  Placenta:               Anterior Right  P. Cord Insertion:      Visualized  Amniotic Fluid  AFI FV:      Within normal limits                              Largest Pocket(cm)                              5.87 ---------------------------------------------------------------------- Biometry  BPD:      49.5  mm     G. Age:  21w 0d         97  %    CI:        71.18   %    70 - 86                                                          FL/HC:      16.6   %    16.1 - 18.3  HC:      186.9  mm  G. Age:  21w 0d         97  %    HC/AC:      1.06        1.09 - 1.39  AC:       176   mm     G. Age:  22w 4d       > 99  %    FL/BPD:     62.8   %  FL:       31.1  mm     G. Age:  19w 5d         57  %    FL/AC:      17.7   %    20 - 24  HUM:      31.8  mm     G. Age:  20w 4d         86  %  CER:      20.6  mm     G. Age:  19w 4d         56  %  NFT:       2.8  mm  CM:        4.4  mm  Est. FW:     406  gm    0 lb 14 oz    > 99  % ---------------------------------------------------------------------- OB History  Gravidity:    4         Term:   2        Prem:   0        SAB:   1  TOP:          9       Ectopic:  9        Living: 2 ---------------------------------------------------------------------- Gestational Age  LMP:           19w 2d        Date:  06/11/19                 EDD:   03/17/20  U/S Today:     21w 1d                                        EDD:   03/04/20  Best:          19w 2d     Det. By:  LMP  (06/11/19)          EDD:   03/17/20 ---------------------------------------------------------------------- Anatomy  Cranium:                Appears normal         Aortic Arch:            Appears normal  Cavum:                 Appears normal         Ductal Arch:            Appears normal  Ventricles:            Mild                   Diaphragm:              Appears normal  Ventriculomegaly                         10 mm  Choroid Plexus:        Appears normal         Stomach:                Appears normal, left                                                                        sided  Cerebellum:            Appears normal         Abdomen:                Appears normal  Posterior Fossa:       Appears normal         Abdominal Wall:         Appears nml (cord                                                                        insert, abd wall)  Nuchal Fold:           Appears normal         Cord Vessels:           Appears normal (3                                                                        vessel cord)  Face:                  Appears normal         Kidneys:                Appear normal                         (orbits and profile)  Lips:                  Appears normal         Bladder:                Appears normal  Thoracic:              Appears normal         Spine:                  Appears normal  Heart:                 Appears normal         Upper Extremities:      Appears normal                         (  4CH, axis, and                         situs)  RVOT:                  Appears normal         Lower Extremities:      Appears normal  LVOT:                  Appears normal  Other:  Heels/feet and open hands/5th digits visualized. Nasal bone          visualized. ---------------------------------------------------------------------- Cervix Uterus Adnexa  Cervix  Length:              4  cm.  Normal appearance by transabdominal scan.  Uterus  No abnormality visualized.  Left Ovary  Within normal limits.  Right Ovary  Within normal limits.  Cul De Sac  No free fluid seen.  Adnexa  No abnormality visualized.  ---------------------------------------------------------------------- Impression  Ms. Herbig, G4 P2, is here for fetal anatomy scan.  Her EDD is established by LMP date that is consistent with  her 6-week ultrasound performed at your office (we do not  have the images available with [place]). She also had ultrasound  performed at Radiology on 07/14/19 and a gestational sac  measurement consistent with 6w 1d was seen. I reviewed the  images and no fetal pole was seen.  On cell-free fetal DNA screening, the risks of fetal  aneuploidies are not increased.  We performed fetal anatomical survey. No markers of fetal  aneuploidies or fetal structural defects are seen. Fetal  biometry is about 2 weeks ahead of established EDD by  LMP, but more consistent with 6-week ultrasound performed  at the Radiology Department (07/14/19).  Occasional fetal arrhythmia are seen (consistent with  premature atrial contractions). I reassured the patient that  this is a benign finding and is rarely associated with cardiac  anomaly. I advised her to cut down on caffeine intake.  Patient reported that since she removed the implant  (Nexplanon), she had irregular periods.  I explained the discrepancy of our findings.  I am not changing her EDD today. We will perform  measurements in 4 weeks and reassign EDD interval growth  appropriate to our ultrasound findings is seen (i.e. assign  EDD at 03/07/20). ---------------------------------------------------------------------- Recommendations  -An appointment was made for her to return in 4 weeks for  fetal growth assessment. ----------------------------------------------------------------------                  Tama High, MD Electronically Signed Final Report   10/24/2019 01:34 pm ----------------------------------------------------------------------   Assessment and Plan:  Pregnancy: [id number] at [redacted]w[redacted]d 1. Supervision of high risk pregnancy, antepartum Stable Glucola next visit  2. Severe  preeclampsia in postpartum period BP stable No S/Sx at present Continue with qd BASA  3. Rh negative state in antepartum period Rhogam @ 28 weeks  4. Ventriculomegaly of brain on fetal ultrasound F/U U/S next week  5. Fetal arrhythmia affecting pregnancy, antepartum See # 4  6. Heart burn  - omeprazole (PRILOSEC) 20 MG capsule; Take 1 capsule (20 mg total) by mouth 2 (two) times daily before a meal.  Dispense: 60 capsule; Refill: 3  Preterm labor symptoms and general obstetric precautions including but not limited to vaginal bleeding, contractions, leaking of fluid and fetal movement were reviewed in detail with the patient. I discussed the assessment and treatment plan with  the patient. The patient was provided an opportunity to ask questions and all were answered. The patient agreed with the plan and demonstrated an understanding of the instructions. The patient was advised to call back or seek an in-person office evaluation/go to MAU at [facility] for any urgent or concerning symptoms. Please refer to After Visit Summary for other counseling recommendations.   I provided 8 minutes of face-to-face time during this encounter.  Return in about 4 weeks (around 12/13/2019) for OB visit, fasting for 2 hr glucola.  Future Appointments  Date Time Provider [place]  11/21/2019  7:45 AM [place] NURSE [place] MFC-US  11/21/2019  7:45 AM [place] 5 WH-MFCUS MFC-US  12/13/2019  8:15 AM Emily Filbert, MD [facility]    Chancy Milroy, [place] for [facility], [place]

## 2019-11-21 ENCOUNTER — Ambulatory Visit (HOSPITAL_COMMUNITY): Payer: 59 | Admitting: *Deleted

## 2019-11-21 ENCOUNTER — Other Ambulatory Visit (HOSPITAL_COMMUNITY): Payer: Self-pay | Admitting: *Deleted

## 2019-11-21 ENCOUNTER — Encounter (HOSPITAL_COMMUNITY): Payer: Self-pay

## 2019-11-21 ENCOUNTER — Ambulatory Visit (HOSPITAL_COMMUNITY)
Admission: RE | Admit: 2019-11-21 | Discharge: 2019-11-21 | Disposition: A | Discharge status: Home / Self Care | Payer: 59 | Source: Ambulatory Visit | Attending: Obstetrics and Gynecology | Admitting: Obstetrics and Gynecology

## 2019-11-21 ENCOUNTER — Other Ambulatory Visit: Payer: Self-pay

## 2019-11-21 VITALS — BP 111/66 | HR 85 | Temp 96.9°F

## 2019-11-21 DIAGNOSIS — O0990 Supervision of high risk pregnancy, unspecified, unspecified trimester: Secondary | ICD-10-CM | POA: Diagnosis present

## 2019-11-21 DIAGNOSIS — Z3A25 25 weeks gestation of pregnancy: Secondary | ICD-10-CM

## 2019-11-21 DIAGNOSIS — O09292 Supervision of pregnancy with other poor reproductive or obstetric history, second trimester: Secondary | ICD-10-CM | POA: Diagnosis not present

## 2019-11-21 DIAGNOSIS — O360120 Maternal care for anti-D [Rh] antibodies, second trimester, not applicable or unspecified: Secondary | ICD-10-CM | POA: Diagnosis not present

## 2019-11-21 DIAGNOSIS — Z3689 Encounter for other specified antenatal screening: Secondary | ICD-10-CM | POA: Insufficient documentation

## 2019-11-21 DIAGNOSIS — O09299 Supervision of pregnancy with other poor reproductive or obstetric history, unspecified trimester: Secondary | ICD-10-CM

## 2019-11-21 DIAGNOSIS — O368320 Maternal care for abnormalities of the fetal heart rate or rhythm, second trimester, not applicable or unspecified: Secondary | ICD-10-CM | POA: Diagnosis not present

## 2019-11-21 DIAGNOSIS — O359XX0 Maternal care for (suspected) fetal abnormality and damage, unspecified, not applicable or unspecified: Secondary | ICD-10-CM

## 2019-11-27 ENCOUNTER — Other Ambulatory Visit: Payer: Self-pay | Admitting: Obstetrics & Gynecology

## 2019-12-09 NOTE — L&D Delivery Note (Signed)
OB/GYN Faculty Practice Delivery Note  Carol [name] is a 34 y.o. [id number] s/p NSVD at [redacted]w[redacted]d. She was admitted for elective IOL.   ROM: 1h 35m with clear fluid GBS Status:  Negative/-- (03/03 1017) Maximum Maternal Temperature: 98.7 F    Labor Progress: . Patient arrived at 1 cm dilation and was induced with foley bulb, misoprostol x3, and reached 6cm, after which she was started on pitocin. She then had AROM followed soon after by uncomplicated NSVD.   Delivery Date/Time: 02/26/2020 at 1916 Delivery: Called to room and patient was complete and pushing. Head delivered in OA position. Nuchal cord x1 not reducible. Shoulder and body delivered with somersault. Infant with no spontaneous cry but good tone, placed on mother's abdomen, dried and stimulated. Cord clamped x 2 after 1-minute delay, and cut by FOB and then taken to warmer for stimulation. Cord blood drawn. Placenta delivered spontaneously with gentle cord traction. Fundus firm with massage and Pitocin. Labia, perineum, vagina, and cervix inspected with hemostatic L periurethral that was not repaired. Infant improved after assessment at the warmer and returned for skin to skin care.   Placenta: 3v intact, to L&D Complications: none Lacerations: hemostatic L periurethral, not repaired EBL: 400 cc Analgesia: epidural   Infant: APGAR (1 MIN):  7 APGAR (5 MINS):  9    Weight: 3929 grams  Augustin Coupe, MD/[facility] OB/GYN Fellow, Faculty Practice

## 2019-12-13 ENCOUNTER — Ambulatory Visit (INDEPENDENT_AMBULATORY_CARE_PROVIDER_SITE_OTHER): Payer: Managed Care, Other (non HMO) | Admitting: Obstetrics & Gynecology

## 2019-12-13 ENCOUNTER — Other Ambulatory Visit: Payer: Self-pay

## 2019-12-13 VITALS — BP 107/70 | HR 90 | Wt 202.0 lb

## 2019-12-13 DIAGNOSIS — Z3A28 28 weeks gestation of pregnancy: Secondary | ICD-10-CM

## 2019-12-13 DIAGNOSIS — O0993 Supervision of high risk pregnancy, unspecified, third trimester: Secondary | ICD-10-CM

## 2019-12-13 DIAGNOSIS — O360930 Maternal care for other rhesus isoimmunization, third trimester, not applicable or unspecified: Secondary | ICD-10-CM | POA: Diagnosis not present

## 2019-12-13 DIAGNOSIS — Z23 Encounter for immunization: Secondary | ICD-10-CM

## 2019-12-13 DIAGNOSIS — O26899 Other specified pregnancy related conditions, unspecified trimester: Secondary | ICD-10-CM

## 2019-12-13 DIAGNOSIS — O26893 Other specified pregnancy related conditions, third trimester: Secondary | ICD-10-CM

## 2019-12-13 DIAGNOSIS — O0990 Supervision of high risk pregnancy, unspecified, unspecified trimester: Secondary | ICD-10-CM

## 2019-12-13 DIAGNOSIS — Z6791 Unspecified blood type, Rh negative: Secondary | ICD-10-CM

## 2019-12-13 MED ORDER — PROMETHAZINE HCL 25 MG PO TABS: 25.0000 mg | ORAL_TABLET | Freq: Four times a day (QID) | ORAL | 1 refills | Status: DC | PRN

## 2019-12-13 MED ORDER — RHO D IMMUNE GLOBULIN 1500 UNIT/2ML IJ SOSY
300.0000 ug | PREFILLED_SYRINGE | Freq: Once | INTRAMUSCULAR | Status: AC
  Administered 2019-12-13: 300 ug via INTRAMUSCULAR

## 2019-12-13 NOTE — Addendum Note (Signed)
Addended by: Emily Filbert on: 12/13/2019 01:56 PM   Modules accepted: Orders

## 2019-12-13 NOTE — Progress Notes (Signed)
Needs refill on phenergan

## 2019-12-13 NOTE — Progress Notes (Signed)
   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  She is currently monitored for the following issues for this high-risk pregnancy and has Migraines; Chronic headache disorder; Cervicocranial syndrome; Osteoarthritis; Rh negative state in antepartum period; Heart burn; History of macrosomia in infant in prior pregnancy, currently pregnant; Abnormal gallbladder ultrasound; Severe preeclampsia in postpartum period; Supervision of high risk pregnancy, antepartum; GAD (generalized anxiety disorder); Allergic rhinosinusitis; LGA (large for gestational age) fetus affecting management of mother; and Fetal arrhythmia affecting pregnancy, antepartum on their problem list.  Patient reports no complaints.  Contractions: Not present. Vag. Bleeding: None.  Movement: Present. Denies leaking of fluid.   The following portions of the patient's history were reviewed and updated as appropriate: allergies, current medications, past family history, past medical history, past social history, past surgical history and problem list.   Objective:   Vitals:   12/13/19 0829  BP: 107/70  Pulse: 90  Weight: 202 lb (91.6 kg)    Fetal Status: Fetal Heart Rate (bpm): 155   Movement: Present     General:  Alert, oriented and cooperative. Patient is in no acute distress.  Skin: Skin is warm and dry. No rash noted.   Cardiovascular: Normal heart rate noted  Respiratory: Normal respiratory effort, no problems with respiration noted  Abdomen: Soft, gravid, appropriate for gestational age.  Pain/Pressure: Absent     Pelvic: Cervical exam deferred        Extremities: Normal range of motion.  Edema: None  Mental Status: Normal mood and affect. Normal behavior. Normal judgment and thought content.   Assessment and Plan:  Pregnancy: [id number] at [redacted]w[redacted]d 1. Supervision of high risk pregnancy, antepartum  - 2 Hour GTT - RPR - CBC - HIV antibody  (with reflex) - Antibody screen  2. Rh negative state in antepartum period - rhogam today  Preterm labor symptoms and general obstetric precautions including but not limited to vaginal bleeding, contractions, leaking of fluid and fetal movement were reviewed in detail with the patient. Please refer to After Visit Summary for other counseling recommendations.   Return in about 3 weeks (around 01/03/2020) for virtual.  Future Appointments  Date Time Provider [place]  01/09/2020 11:00 AM [place] MFC-US  01/09/2020 11:00 AM WH-MFC [place] 3 WH-MFCUS MFC-US    Emily Filbert, MD

## 2019-12-14 LAB — CBC
Hematocrit: 35.4 % (ref 34.0–46.6)
Hemoglobin: 12.3 g/dL (ref 11.1–15.9)
MCH: 30.4 pg (ref 26.6–33.0)
MCHC: 34.7 g/dL (ref 31.5–35.7)
MCV: 87 fL (ref 79–97)
Platelets: 293 10*3/uL (ref 150–450)
RBC: 4.05 x10E6/uL (ref 3.77–5.28)
RDW: 12.7 % (ref 11.7–15.4)
WBC: 10.2 10*3/uL (ref 3.4–10.8)

## 2019-12-14 LAB — GLUCOSE TOLERANCE, 2 HOURS W/ 1HR
Glucose, 1 hour: 96 mg/dL (ref 65–179)
Glucose, 2 hour: 91 mg/dL (ref 65–152)
Glucose, Fasting: 74 mg/dL (ref 65–91)

## 2019-12-14 LAB — RPR: RPR Ser Ql: NONREACTIVE

## 2019-12-14 LAB — HIV ANTIBODY (ROUTINE TESTING W REFLEX): HIV Screen 4th Generation wRfx: NONREACTIVE

## 2019-12-14 LAB — ANTIBODY SCREEN: Antibody Screen: NEGATIVE

## 2020-01-01 IMAGING — US US MFM OB FOLLOW-UP
1 series · 13 of 28 positions shown · non-contrast
Comparison: none

[Series 1: us mfm ob follow-up · 44 acquisitions, 13 frames shown]
[im 2/44]
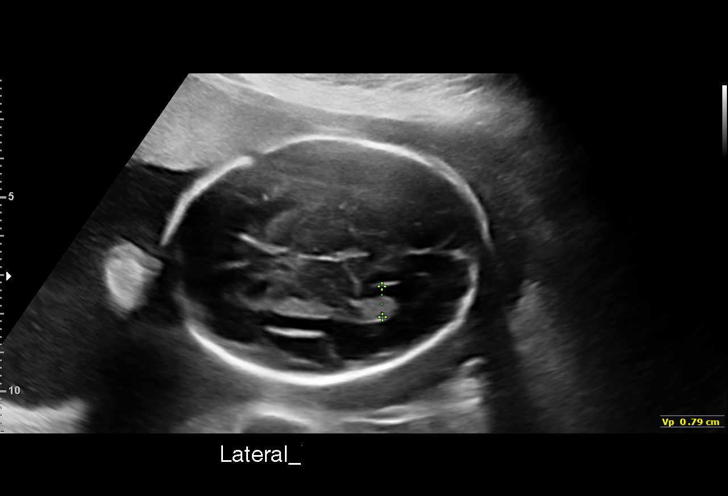
[im 5/44]
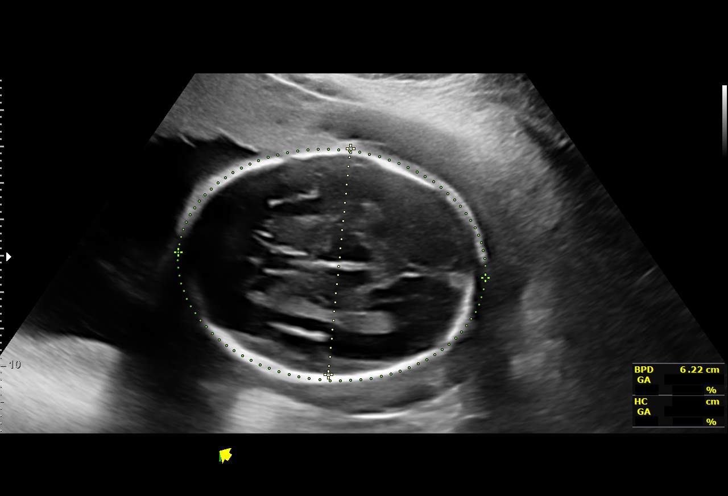
[im 8/44]
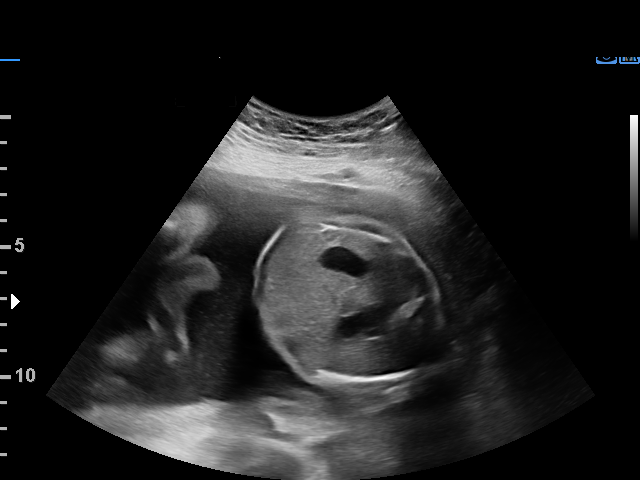
[im 12/44]
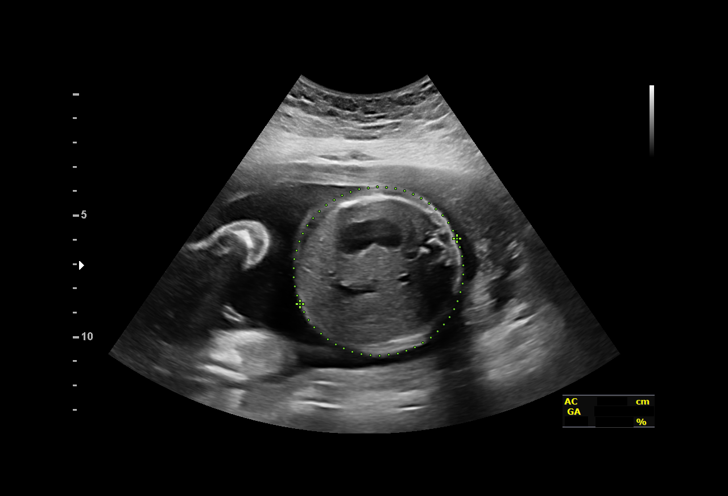
[im 15/44]
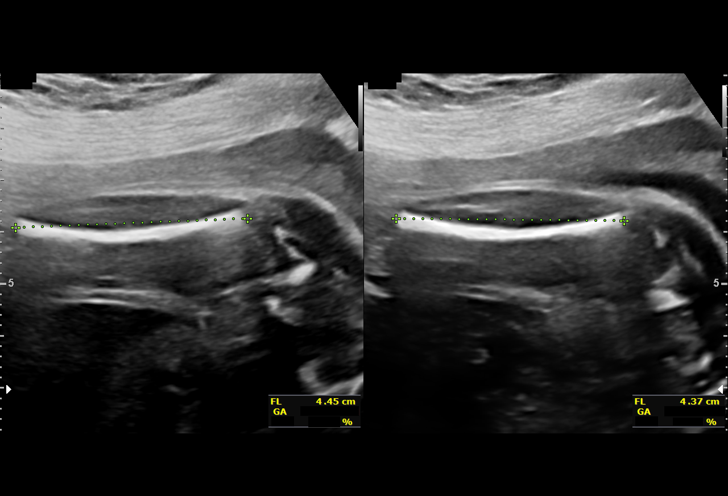
[im 18/44]
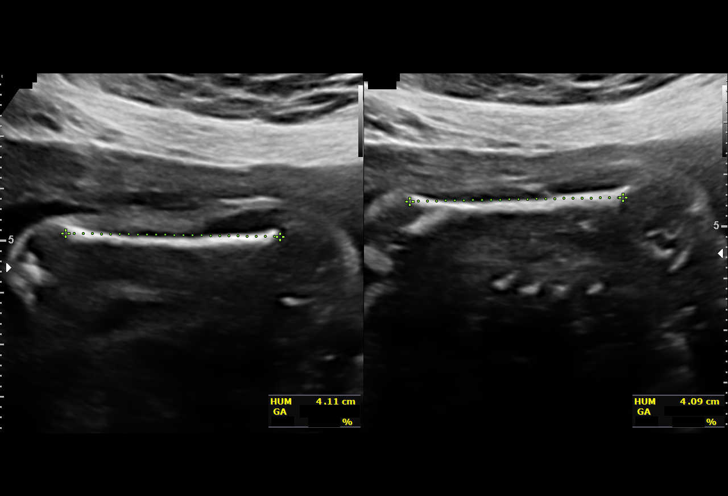
[im 23/44]
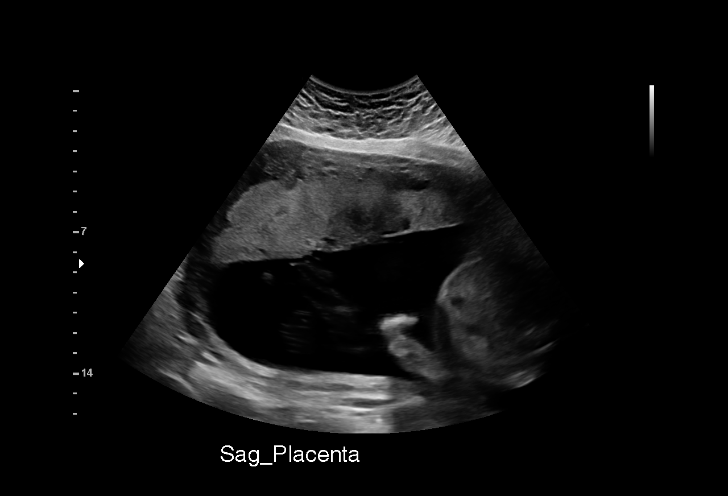
[im 26/44]
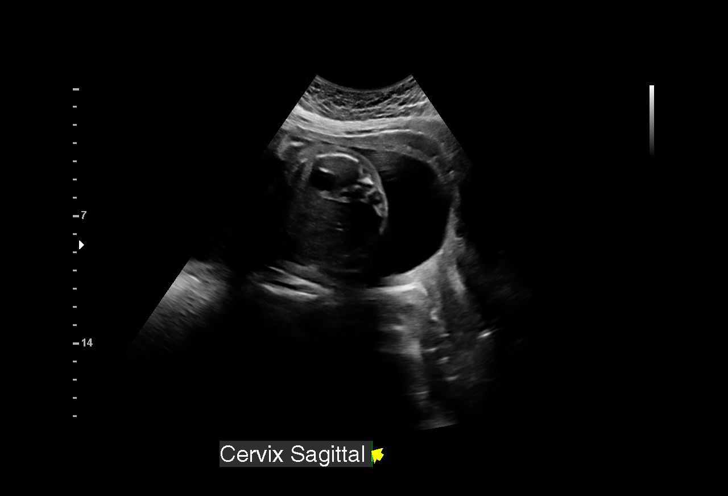
[im 29/44]
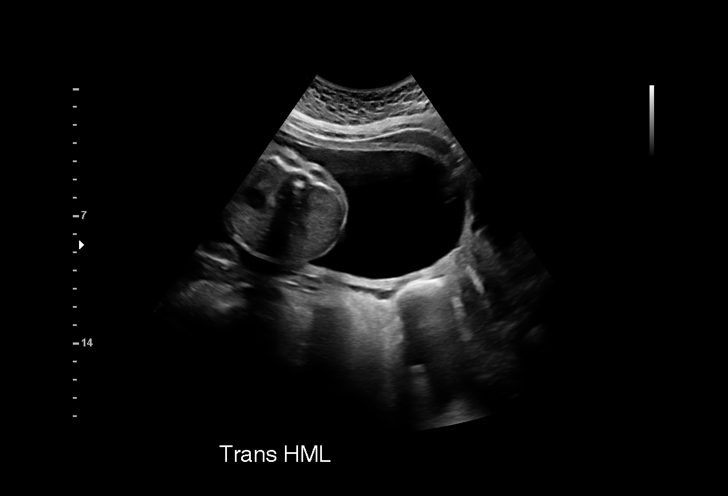
[im 32/44]
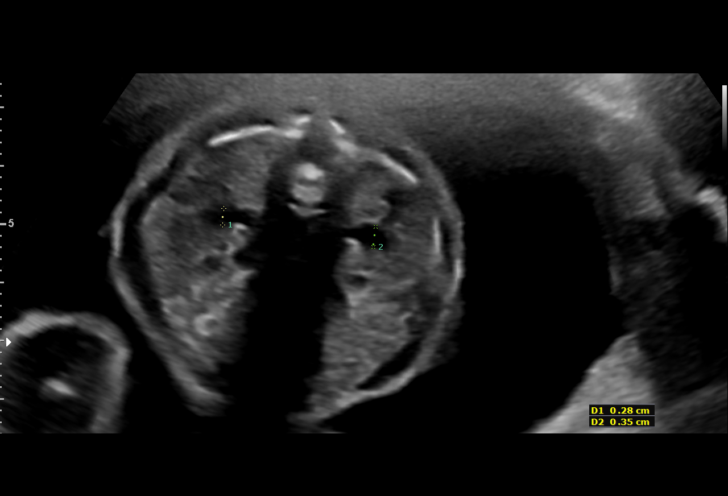
[im 36/44]
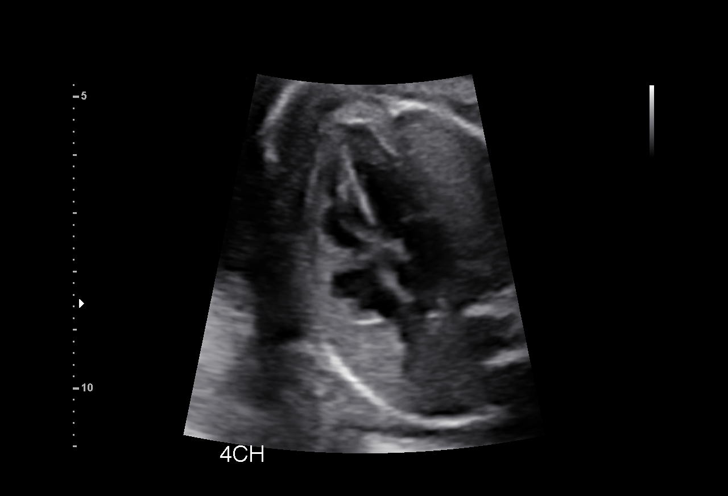
[im 39/44]
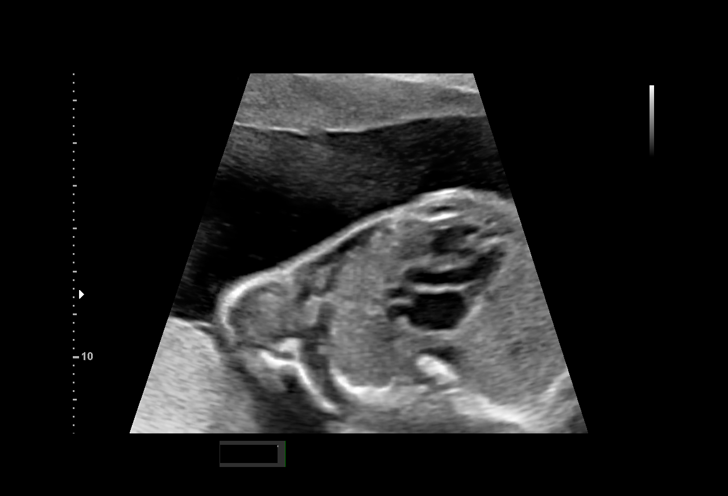
[im 42/44]
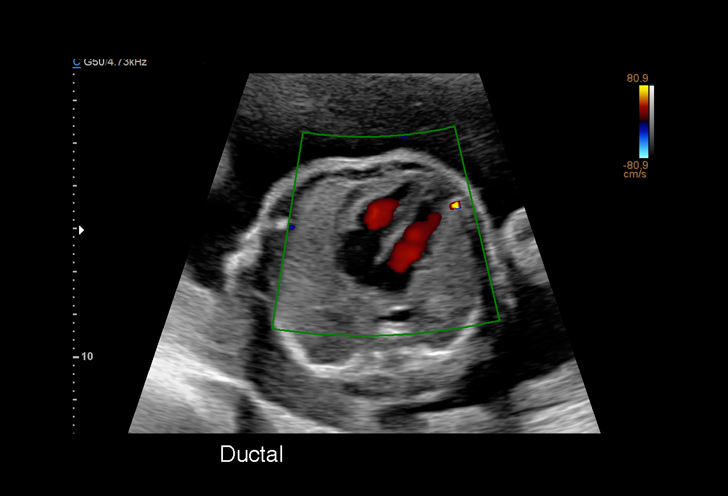

[13 of 28 positions shown; findings below may reference images not displayed]

----------------------------------------------------------------------

 ----------------------------------------------------------------------
Indications

  25 weeks gestation of pregnancy
  Fetal abnormality - other known or
  suspected (ventriculomegaly) (not seen on
  today's US)
  Fetal arrhythmia affecting pregnancy,
  antepartum (not seen on today's US)
  Rh negative state in antepartum
  Poor obstetric history: Prior fetal
  macrosomia, prior preeclampsia (ASA)
 ----------------------------------------------------------------------
Fetal Evaluation

 Num Of Fetuses:         1
 Fetal Heart Rate(bpm):  122
 Cardiac Activity:       Observed
 Presentation:           Transverse, head to maternal left
 Placenta:               Anterior
 P. Cord Insertion:      Previously Visualized

 Amniotic Fluid
 AFI FV:      Within normal limits

                             Largest Pocket(cm)

Biometry

 BPD:      61.8  mm     G. Age:  25w 1d         40  %    CI:        70.53   %    70 - 86
                                                         FL/HC:      19.1   %    18.7 -
 HC:      234.6  mm     G. Age:  25w 4d         41  %    HC/AC:      1.09        1.04 -
 AC:      214.6  mm     G. Age:  26w 0d         67  %    FL/BPD:     72.5   %    71 - 87
 FL:       44.8  mm     G. Age:  24w 5d         26  %    FL/AC:      20.9   %    20 - 24
 HUM:      41.5  mm     G. Age:  25w 1d         41  %
 LV:        7.5  mm
 Est. FW:     811  gm    1 lb 13 oz      53  %
OB History

 Gravidity:    4         Term:   2        Prem:   0        SAB:   1
 TOP:          9       Ectopic:  9        Living: 2
Gestational Age

 LMP:           23w 2d        Date:  06/11/19                 EDD:   03/17/20
 U/S Today:     25w 3d                                        EDD:   03/02/20
 Best:          25w 1d     Det. By:  Previous Ultrasound      EDD:   03/04/20
                                     (10/24/19)
Anatomy

 Cranium:               Appears normal         Aortic Arch:            Previously seen
 Cavum:                 Appears normal         Ductal Arch:            Previously seen
 Ventricles:            Appears normal         Diaphragm:              Previously seen
 Choroid Plexus:        Previously seen        Stomach:                Appears normal, left
                                                                       sided
 Cerebellum:            Previously seen        Abdomen:                Appears normal
 Posterior Fossa:       Previously seen        Abdominal Wall:         Previously seen
 Nuchal Fold:           Previously seen        Cord Vessels:           Previously seen
 Face:                  Orbits and profile     Kidneys:                Appear normal
                        previously seen
 Lips:                  Previously seen        Bladder:                Appears normal
 Thoracic:              Appears normal         Spine:                  Previously seen
 Heart:                 Appears normal         Upper Extremities:      Previously seen
                        (4CH, axis, and
                        situs)
 RVOT:                  Previously seen        Lower Extremities:      Previously seen
 LVOT:                  Previously seen

 Other:  Heels/feet and open hands/5th digits prev visualized. Nasal bone
         prev visualized.
Cervix Uterus Adnexa

 Cervix
 Length:           4.21  cm.
 Normal appearance by transabdominal scan.
Impression

 Patient return for fetal growth assessment and evaluation of
 lateral ventricles.  Her EDD was established by LMP date,
 which she is not sure of.
 On today's ultrasound, fetal biometry is still ahead of
 gestational age by 2 weeks.  Amniotic fluid is normal good
 fetal activity seen.  Lateral ventricular measurements are
 within normal limits and there is no evidence of
 ventriculomegaly.
 I explained the findings and informed her that her pregnancy
 should be dated by first ultrasound performed at our center a
 month ago.
 We have assigned her EDD at 03/04/2020 and her
 gestational age today is 25w 1d.
Recommendations

 -An appointment was made for her to return in 6 weeks for
 fetal growth assessment and to evaluate lateral ventricles.
                 Bertsch, Shanice

## 2020-01-03 ENCOUNTER — Other Ambulatory Visit: Payer: Self-pay

## 2020-01-03 ENCOUNTER — Encounter: Payer: Self-pay | Admitting: Obstetrics & Gynecology

## 2020-01-03 ENCOUNTER — Telehealth (INDEPENDENT_AMBULATORY_CARE_PROVIDER_SITE_OTHER): Payer: Managed Care, Other (non HMO) | Admitting: Obstetrics & Gynecology

## 2020-01-03 VITALS — BP 116/68

## 2020-01-03 DIAGNOSIS — O0993 Supervision of high risk pregnancy, unspecified, third trimester: Secondary | ICD-10-CM

## 2020-01-03 DIAGNOSIS — O350XX0 Maternal care for (suspected) central nervous system malformation in fetus, not applicable or unspecified: Secondary | ICD-10-CM

## 2020-01-03 DIAGNOSIS — O0990 Supervision of high risk pregnancy, unspecified, unspecified trimester: Secondary | ICD-10-CM

## 2020-01-03 DIAGNOSIS — Z3A31 31 weeks gestation of pregnancy: Secondary | ICD-10-CM

## 2020-01-03 DIAGNOSIS — IMO0002 Reserved for concepts with insufficient information to code with codable children: Secondary | ICD-10-CM

## 2020-01-03 NOTE — Patient Instructions (Signed)
Return to office for any scheduled appointments. Call the office or go to the MAU at [facility] if:  You begin to have strong, frequent contractions  Your water breaks.  Sometimes it is a big gush of fluid, sometimes it is just a trickle that keeps getting your panties wet or running down your legs  You have vaginal bleeding.  It is normal to have a small amount of spotting if your cervix was checked.   You do not feel your baby moving like normal.  If you do not, get something to eat and drink and lay down and focus on feeling your baby move.   If your baby is still not moving like normal, you should call the office or go to MAU.  Any other obstetric concerns.   

## 2020-01-03 NOTE — Progress Notes (Signed)
I connected with  Para March on 01/03/20 at 10:45 AM EST by telephone and verified that I am speaking with the correct person using two identifiers.   I discussed the limitations, risks, security and privacy concerns of performing an evaluation and management service by telephone and the availability of in person appointments. I also discussed with the patient that there may be a patient responsible charge related to this service. The patient expressed understanding and agreed to proceed.  Crosby Oyster, RN 01/03/2020  10:45 AM

## 2020-01-03 NOTE — Progress Notes (Signed)
TELEHEALTH OBSTETRICS PRENATAL VIRTUAL VIDEO VISIT ENCOUNTER NOTE  Provider location: [facility] [place] [facility]   I connected with Carol [name] on 01/03/20 at 10:45 AM EST by MyChart Video Encounter at home and verified that I am speaking with the correct person using two identifiers.   I discussed the limitations, risks, security and privacy concerns of performing an evaluation and management service virtually and the availability of in person appointments. I also discussed with the patient that there may be a patient responsible charge related to this service. The patient expressed understanding and agreed to proceed. 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  She is currently monitored for the following issues for this high-risk pregnancy and has Migraines; Chronic headache disorder; Cervicocranial syndrome; Osteoarthritis; Rh negative state in antepartum period; Heart burn; History of macrosomia in infant in prior pregnancy, currently pregnant; Abnormal gallbladder ultrasound; Severe preeclampsia in postpartum period; Supervision of high risk pregnancy, antepartum; GAD (generalized anxiety disorder); Allergic rhinosinusitis; LGA (large for gestational age) fetus affecting management of mother; and Fetal arrhythmia affecting pregnancy, antepartum on their problem list.  Patient reports no complaints.  Contractions: Not present. Vag. Bleeding: None.  Movement: Present. Denies any leaking of fluid.   The following portions of the patient's history were reviewed and updated as appropriate: allergies, current medications, past family history, past medical history, past social history, past surgical history and problem list.   Objective:   Vitals:   01/03/20 1045  BP: 116/68    Fetal Status:     Movement: Present     General:  Alert, oriented and cooperative. Patient is in no acute distress.    Respiratory: Normal respiratory effort, no problems with respiration noted  Mental Status: Normal mood and affect. Normal behavior. Normal judgment and thought content.  Rest of physical exam deferred due to type of encounter  Imaging: [place] MFM OB DETAIL +14 WK  Result Date: 10/24/2019 ----------------------------------------------------------------------  OBSTETRICS REPORT                       (Signed Final 10/24/2019 01:34 pm) ---------------------------------------------------------------------- Patient Info  ID #:       [id number]                          D.O.B.:  31-Jul-1986 (33 yrs)  Name:       Carol [name]             Visit Date: 10/24/2019 11:28 am ---------------------------------------------------------------------- Performed By  Performed By:     Rodrigo Ran BS      Ref. Address:     [address]                                                             [address]  Attending:        Tama High MD        [facility]  Referred By:      [facility] ---------------------------------------------------------------------- Orders   #  Description                          Code         Ordered By   1  [place] MFM OB DETAIL +14 [place]              [id number]     Aletha Halim  ----------------------------------------------------------------------   #  Order #                    Accession #                 Episode #   1  [id number]                  [id number]                  [id number]  ---------------------------------------------------------------------- Indications   Antenatal screening for malformations (low     Z36.3   risk NIPS, neg [place])   Fetal abnormality - other known or             O35.9XX0   suspected (ventriculomegaly)   Fetal arrhythmia affecting pregnancy,          O36.8390   antepartum   Rh negative state in antepartum                O36.0190   Poor obstetric  history: Prior fetal            O09.299   macrosomia, prior preeclampsia (ASA)   [redacted] weeks gestation of pregnancy                Z3A.19  ---------------------------------------------------------------------- Fetal Evaluation  Num Of Fetuses:         1  Fetal Heart Rate(bpm):  133  Cardiac Activity:       Arrhythmia noted  Presentation:           Variable  Placenta:               Anterior Right  P. Cord Insertion:      Visualized  Amniotic Fluid  AFI FV:      Within normal limits                              Largest Pocket(cm)                              5.87 ---------------------------------------------------------------------- Biometry  BPD:      49.5  mm     G. Age:  21w 0d         97  %    CI:        71.18   %    70 - 86                                                          FL/HC:      16.6   %    16.1 - 18.3  HC:      186.9  mm  G. Age:  21w 0d         97  %    HC/AC:      1.06        1.09 - 1.39  AC:       176   mm     G. Age:  22w 4d       > 99  %    FL/BPD:     62.8   %  FL:       31.1  mm     G. Age:  19w 5d         57  %    FL/AC:      17.7   %    20 - 24  HUM:      31.8  mm     G. Age:  20w 4d         86  %  CER:      20.6  mm     G. Age:  19w 4d         56  %  NFT:       2.8  mm  CM:        4.4  mm  Est. FW:     406  gm    0 lb 14 oz    > 99  % ---------------------------------------------------------------------- OB History  Gravidity:    4         Term:   2        Prem:   0        SAB:   1  TOP:          9       Ectopic:  9        Living: 2 ---------------------------------------------------------------------- Gestational Age  LMP:           19w 2d        Date:  06/11/19                 EDD:   03/17/20  U/S Today:     21w 1d                                        EDD:   03/04/20  Best:          19w 2d     Det. By:  LMP  (06/11/19)          EDD:   03/17/20 ---------------------------------------------------------------------- Anatomy  Cranium:               Appears normal         Aortic Arch:             Appears normal  Cavum:                 Appears normal         Ductal Arch:            Appears normal  Ventricles:            Mild                   Diaphragm:              Appears normal  Ventriculomegaly                         10 mm  Choroid Plexus:        Appears normal         Stomach:                Appears normal, left                                                                        sided  Cerebellum:            Appears normal         Abdomen:                Appears normal  Posterior Fossa:       Appears normal         Abdominal Wall:         Appears nml (cord                                                                        insert, abd wall)  Nuchal Fold:           Appears normal         Cord Vessels:           Appears normal (3                                                                        vessel cord)  Face:                  Appears normal         Kidneys:                Appear normal                         (orbits and profile)  Lips:                  Appears normal         Bladder:                Appears normal  Thoracic:              Appears normal         Spine:                  Appears normal  Heart:                 Appears normal         Upper Extremities:      Appears normal                         (  4CH, axis, and                         situs)  RVOT:                  Appears normal         Lower Extremities:      Appears normal  LVOT:                  Appears normal  Other:  Heels/feet and open hands/5th digits visualized. Nasal bone          visualized. ---------------------------------------------------------------------- Cervix Uterus Adnexa  Cervix  Length:              4  cm.  Normal appearance by transabdominal scan.  Uterus  No abnormality visualized.  Left Ovary  Within normal limits.  Right Ovary  Within normal limits.  Cul De Sac  No free fluid seen.  Adnexa  No abnormality visualized.  ---------------------------------------------------------------------- Impression  Ms. Freeney, G4 P2, is here for fetal anatomy scan.  Her EDD is established by LMP date that is consistent with  her 6-week ultrasound performed at your office (we do not  have the images available with [place]). She also had ultrasound  performed at Radiology on 07/14/19 and a gestational sac  measurement consistent with 6w 1d was seen. I reviewed the  images and no fetal pole was seen.  On cell-free fetal DNA screening, the risks of fetal  aneuploidies are not increased.  We performed fetal anatomical survey. No markers of fetal  aneuploidies or fetal structural defects are seen. Fetal  biometry is about 2 weeks ahead of established EDD by  LMP, but more consistent with 6-week ultrasound performed  at the Radiology Department (07/14/19).  Occasional fetal arrhythmia are seen (consistent with  premature atrial contractions). I reassured the patient that  this is a benign finding and is rarely associated with cardiac  anomaly. I advised her to cut down on caffeine intake.  Patient reported that since she removed the implant  (Nexplanon), she had irregular periods.  I explained the discrepancy of our findings.  I am not changing her EDD today. We will perform  measurements in 4 weeks and reassign EDD interval growth  appropriate to our ultrasound findings is seen (i.e. assign  EDD at 03/07/20). ---------------------------------------------------------------------- Recommendations  -An appointment was made for her to return in 4 weeks for  fetal growth assessment. ----------------------------------------------------------------------                  Tama High, MD Electronically Signed Final Report   10/24/2019 01:34 pm ----------------------------------------------------------------------  [place] MFM OB FOLLOW UP  Result Date: 11/21/2019 ----------------------------------------------------------------------  OBSTETRICS REPORT                        (Signed Final 11/21/2019 10:19 am) ---------------------------------------------------------------------- Patient Info  ID #:       [id number]                          D.O.B.:  1986/04/05 (33 yrs)  Name:       Carol [name]             Visit Date: 11/21/2019 08:38 am ---------------------------------------------------------------------- Performed By  Performed By:     Corky Crafts             Ref. Address:  [place]  Attending:        Tama High MD        [facility]  Referred By:      [place] ---------------------------------------------------------------------- Orders   #  Description                          Code         Ordered By   1  [place] MFM OB FOLLOW UP                  [id number]     Tama High  ----------------------------------------------------------------------   #  Order #                    Accession #                 Episode #   1  [id number]                  [id number]                  [id number]  ---------------------------------------------------------------------- Indications   [redacted] weeks gestation of pregnancy                Z3A.25   Fetal abnormality - other known or             O35.9XX0   suspected (ventriculomegaly) (not seen on   today's [place])   Fetal arrhythmia affecting pregnancy,          O36.8390   antepartum (not seen on today's [place])   Rh negative state in antepartum                O36.0190   Poor obstetric history: Prior fetal            O09.299   macrosomia, prior preeclampsia (ASA)  ---------------------------------------------------------------------- Fetal Evaluation  Num Of Fetuses:         1  Fetal Heart Rate(bpm):  122  Cardiac Activity:       Observed  Presentation:           Transverse, head to maternal left  Placenta:               Anterior  P. Cord Insertion:       Previously Visualized  Amniotic Fluid  AFI FV:      Within normal limits  Largest Pocket(cm)                              5.57 ---------------------------------------------------------------------- Biometry  BPD:      61.8  mm     G. Age:  25w 1d         40  %    CI:        70.53   %    70 - 86                                                          FL/HC:      19.1   %    18.7 - 20.3  HC:      234.6  mm     G. Age:  25w 4d         41  %    HC/AC:      1.09        1.04 - 1.22  AC:      214.6  mm     G. Age:  26w 0d         67  %    FL/BPD:     72.5   %    71 - 87  FL:       44.8  mm     G. Age:  24w 5d         26  %    FL/AC:      20.9   %    20 - 24  HUM:      41.5  mm     G. Age:  25w 1d         41  %  LV:        7.5  mm  Est. FW:     811  gm    1 lb 13 oz      53  % ---------------------------------------------------------------------- OB History  Gravidity:    4         Term:   2        Prem:   0        SAB:   1  TOP:          9       Ectopic:  9        Living: 2 ---------------------------------------------------------------------- Gestational Age  LMP:           23w 2d        Date:  06/11/19                 EDD:   03/17/20  U/S Today:     25w 3d                                        EDD:   03/02/20  Best:          25w 1d     Det. By:  Previous Ultrasound      EDD:   03/04/20                                      (  10/24/19) ---------------------------------------------------------------------- Anatomy  Cranium:               Appears normal         Aortic Arch:            Previously seen  Cavum:                 Appears normal         Ductal Arch:            Previously seen  Ventricles:            Appears normal         Diaphragm:              Previously seen  Choroid Plexus:        Previously seen        Stomach:                Appears normal, left                                                                        sided  Cerebellum:            Previously seen        Abdomen:                 Appears normal  Posterior Fossa:       Previously seen        Abdominal Wall:         Previously seen  Nuchal Fold:           Previously seen        Cord Vessels:           Previously seen  Face:                  Orbits and profile     Kidneys:                Appear normal                         previously seen  Lips:                  Previously seen        Bladder:                Appears normal  Thoracic:              Appears normal         Spine:                  Previously seen  Heart:                 Appears normal         Upper Extremities:      Previously seen                         (4CH, axis, and                         situs)  RVOT:  Previously seen        Lower Extremities:      Previously seen  LVOT:                  Previously seen  Other:  Heels/feet and open hands/5th digits prev visualized. Nasal bone          prev visualized. ---------------------------------------------------------------------- Cervix Uterus Adnexa  Cervix  Length:           4.21  cm.  Normal appearance by transabdominal scan. ---------------------------------------------------------------------- Impression  Patient return for fetal growth assessment and evaluation of  lateral ventricles.  Her EDD was established by LMP date,  which she is not sure of.  On today's ultrasound, fetal biometry is still ahead of  gestational age by 2 weeks.  Amniotic fluid is normal good  fetal activity seen.  Lateral ventricular measurements are  within normal limits and there is no evidence of  ventriculomegaly.  I explained the findings and informed her that her pregnancy  should be dated by first ultrasound performed at our center a  month ago.  We have assigned her EDD at 03/04/2020 and her  gestational age today is 25w 1d. ---------------------------------------------------------------------- Recommendations  -An appointment was made for her to return in 6 weeks for  fetal growth assessment and to evaluate lateral  ventricles. ----------------------------------------------------------------------                  Tama High, MD Electronically Signed Final Report   11/21/2019 10:19 am ----------------------------------------------------------------------    Assessment and Plan:  Pregnancy: [id number] at [redacted]w[redacted]d 1. Ventriculomegaly of brain on fetal ultrasound Repeat scan ordered on 01/09/20, will follow up results and manage accordingly.  2. Supervision of high risk pregnancy, antepartum Normal third trimester labs. No other concerns.  Preterm labor symptoms and general obstetric precautions including but not limited to vaginal bleeding, contractions, leaking of fluid and fetal movement were reviewed in detail with the patient. I discussed the assessment and treatment plan with the patient. The patient was provided an opportunity to ask questions and all were answered. The patient agreed with the plan and demonstrated an understanding of the instructions. The patient was advised to call back or seek an in-person office evaluation/go to MAU at [facility] for any urgent or concerning symptoms. Please refer to After Visit Summary for other counseling recommendations.   I provided 5 minutes of face-to-face time during this encounter.  Return in about 2 weeks (around 01/17/2020) for Virtual OB Visit.  Future Appointments  Date Time Provider [place]  01/09/2020 11:00 AM [place] [facility]  01/09/2020 11:00 AM [place] 3 [facility]  01/31/2020  8:30 AM Aletha Halim, MD [facility]  02/14/2020  8:15 AM Donn[place]e Jude, MD [facility]    Verita Schneiders, MD [facility] [place] [place]

## 2020-01-09 ENCOUNTER — Ambulatory Visit (HOSPITAL_COMMUNITY): Payer: 59

## 2020-01-16 ENCOUNTER — Other Ambulatory Visit (HOSPITAL_COMMUNITY): Payer: Self-pay | Admitting: *Deleted

## 2020-01-16 ENCOUNTER — Ambulatory Visit (HOSPITAL_COMMUNITY)
Admission: RE | Admit: 2020-01-16 | Discharge: 2020-01-16 | Disposition: A | Discharge status: Home / Self Care | Payer: Managed Care, Other (non HMO) | Source: Ambulatory Visit | Attending: Obstetrics and Gynecology | Admitting: Obstetrics and Gynecology

## 2020-01-16 ENCOUNTER — Ambulatory Visit (HOSPITAL_COMMUNITY): Payer: Managed Care, Other (non HMO) | Admitting: *Deleted

## 2020-01-16 ENCOUNTER — Other Ambulatory Visit: Payer: Self-pay

## 2020-01-16 ENCOUNTER — Encounter (HOSPITAL_COMMUNITY): Payer: Self-pay

## 2020-01-16 VITALS — BP 114/65 | HR 92 | Temp 97.5°F

## 2020-01-16 DIAGNOSIS — O09299 Supervision of pregnancy with other poor reproductive or obstetric history, unspecified trimester: Secondary | ICD-10-CM | POA: Insufficient documentation

## 2020-01-16 DIAGNOSIS — Z362 Encounter for other antenatal screening follow-up: Secondary | ICD-10-CM

## 2020-01-16 DIAGNOSIS — O09293 Supervision of pregnancy with other poor reproductive or obstetric history, third trimester: Secondary | ICD-10-CM

## 2020-01-16 DIAGNOSIS — O360190 Maternal care for anti-D [Rh] antibodies, unspecified trimester, not applicable or unspecified: Secondary | ICD-10-CM

## 2020-01-16 DIAGNOSIS — O0990 Supervision of high risk pregnancy, unspecified, unspecified trimester: Secondary | ICD-10-CM

## 2020-01-16 DIAGNOSIS — Z3A33 33 weeks gestation of pregnancy: Secondary | ICD-10-CM | POA: Diagnosis not present

## 2020-01-18 ENCOUNTER — Other Ambulatory Visit: Payer: Self-pay | Admitting: *Deleted

## 2020-01-18 MED ORDER — CYCLOBENZAPRINE HCL 10 MG PO TABS: 10.0000 mg | ORAL_TABLET | Freq: Three times a day (TID) | ORAL | 0 refills | Status: DC | PRN

## 2020-01-31 ENCOUNTER — Telehealth (INDEPENDENT_AMBULATORY_CARE_PROVIDER_SITE_OTHER): Payer: Managed Care, Other (non HMO) | Admitting: Obstetrics and Gynecology

## 2020-01-31 ENCOUNTER — Other Ambulatory Visit: Payer: Self-pay

## 2020-01-31 VITALS — BP 108/58

## 2020-01-31 DIAGNOSIS — O368330 Maternal care for abnormalities of the fetal heart rate or rhythm, third trimester, not applicable or unspecified: Secondary | ICD-10-CM

## 2020-01-31 DIAGNOSIS — O09293 Supervision of pregnancy with other poor reproductive or obstetric history, third trimester: Secondary | ICD-10-CM

## 2020-01-31 DIAGNOSIS — O3663X0 Maternal care for excessive fetal growth, third trimester, not applicable or unspecified: Secondary | ICD-10-CM

## 2020-01-31 DIAGNOSIS — Z3A35 35 weeks gestation of pregnancy: Secondary | ICD-10-CM

## 2020-01-31 DIAGNOSIS — O26899 Other specified pregnancy related conditions, unspecified trimester: Secondary | ICD-10-CM

## 2020-01-31 DIAGNOSIS — O26893 Other specified pregnancy related conditions, third trimester: Secondary | ICD-10-CM

## 2020-01-31 DIAGNOSIS — O368390 Maternal care for abnormalities of the fetal heart rate or rhythm, unspecified trimester, not applicable or unspecified: Secondary | ICD-10-CM

## 2020-01-31 DIAGNOSIS — O1495 Unspecified pre-eclampsia, complicating the puerperium: Secondary | ICD-10-CM

## 2020-01-31 DIAGNOSIS — O0990 Supervision of high risk pregnancy, unspecified, unspecified trimester: Secondary | ICD-10-CM

## 2020-01-31 DIAGNOSIS — Z6791 Unspecified blood type, Rh negative: Secondary | ICD-10-CM

## 2020-01-31 DIAGNOSIS — O09299 Supervision of pregnancy with other poor reproductive or obstetric history, unspecified trimester: Secondary | ICD-10-CM

## 2020-01-31 NOTE — Progress Notes (Signed)
   TELEHEALTH VIRTUAL OBSTETRICS VISIT ENCOUNTER NOTE  Clinic: [facility]  I connected with Para March on 01/31/20 at  8:30 AM EST by telephone at home and verified that I am speaking with the correct person using two identifiers.   I discussed the limitations, risks, security and privacy concerns of performing an evaluation and management service by telephone and the availability of in person appointments. I also discussed with the patient that there may be a patient responsible charge related to this service. The patient expressed understanding and agreed to proceed.  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  She is currently monitored for the following issues for this low-risk pregnancy and has Migraines; Chronic headache disorder; Cervicocranial syndrome; Osteoarthritis; Rh negative state in antepartum period; Heart burn; History of macrosomia in infant in prior pregnancy, currently pregnant; Abnormal gallbladder ultrasound; Severe preeclampsia in postpartum period; Supervision of high risk pregnancy, antepartum; GAD (generalized anxiety disorder); Allergic rhinosinusitis; LGA (large for gestational age) fetus affecting management of mother; and Fetal arrhythmia affecting pregnancy, antepartum on their problem list.  Patient reports no complaints. Reports fetal movement. Denies any contractions, bleeding or leaking of fluid.   The following portions of the patient's history were reviewed and updated as appropriate: allergies, current medications, past family history, past medical history, past social history, past surgical history and problem list.   Objective:   Vitals:   01/31/20 0821  BP: (!) 108/58    Babyscripts Data Reviewed: not applicable  General:  Alert, oriented and cooperative.   Mental Status: Normal mood and affect perceived. Normal judgment and thought content.  Rest of  physical exam deferred due to type of encounter  Assessment and Plan:  Pregnancy: [id number] at [redacted]w[redacted]d 1. Supervision of high risk pregnancy, antepartum GBS, swabs nv  2. Severe preeclampsia in postpartum period Continue low dose ASA  3. Rh negative state in antepartum period Rhogam pp prn  4. Excessive fetal growth affecting management of pregnancy in third trimester, single or unspecified fetus Borderline last u/s. F/u u/s early march  5. History of macrosomia in infant in prior pregnancy, currently pregnant See above  6. Fetal arrhythmia affecting pregnancy, antepartum Resolved.   Preterm labor symptoms and general obstetric precautions including but not limited to vaginal bleeding, contractions, leaking of fluid and fetal movement were reviewed in detail with the patient.  I discussed the assessment and treatment plan with the patient. The patient was provided an opportunity to ask questions and all were answered. The patient agreed with the plan and demonstrated an understanding of the instructions. The patient was advised to call back or seek an in-person office evaluation/go to MAU at [facility] for any urgent or concerning symptoms. Please refer to After Visit Summary for other counseling recommendations.   I provided 7 minutes of non-face-to-face time during this encounter. The visit was conducted via MyChart-medicine  Return in about 1 week (around 02/07/2020) for in person, low risk.  Future Appointments  Date Time Provider [place]  02/14/2020  8:15 AM Donnamae Jude, MD [facility]  02/16/2020  8:45 AM [facility] NURSE [place] [facility]  02/16/2020  8:45 AM [place] 2 [facility]-US    Aletha Halim, [place] for [facility], [place]

## 2020-01-31 NOTE — Progress Notes (Signed)
I connected with  Para March on 01/31/20 at  8:30 AM EST by telephone and verified that I am speaking with the correct person using two identifiers.   I discussed the limitations, risks, security and privacy concerns of performing an evaluation and management service by telephone and the availability of in person appointments. I also discussed with the patient that there may be a patient responsible charge related to this service. The patient expressed understanding and agreed to proceed.  Crosby Oyster, RN 01/31/2020  8:21 AM

## 2020-02-08 ENCOUNTER — Ambulatory Visit (INDEPENDENT_AMBULATORY_CARE_PROVIDER_SITE_OTHER): Payer: Managed Care, Other (non HMO) | Admitting: Advanced Practice Midwife

## 2020-02-08 ENCOUNTER — Other Ambulatory Visit: Payer: Self-pay

## 2020-02-08 ENCOUNTER — Other Ambulatory Visit (HOSPITAL_COMMUNITY)
Admission: RE | Admit: 2020-02-08 | Discharge: 2020-02-08 | Disposition: A | Discharge status: Home / Self Care | Payer: 59 | Source: Ambulatory Visit | Attending: Advanced Practice Midwife | Admitting: Advanced Practice Midwife

## 2020-02-08 VITALS — BP 127/74 | HR 71 | Wt 209.4 lb

## 2020-02-08 DIAGNOSIS — Z3A36 36 weeks gestation of pregnancy: Secondary | ICD-10-CM

## 2020-02-08 DIAGNOSIS — Z3483 Encounter for supervision of other normal pregnancy, third trimester: Secondary | ICD-10-CM | POA: Insufficient documentation

## 2020-02-08 DIAGNOSIS — O3663X0 Maternal care for excessive fetal growth, third trimester, not applicable or unspecified: Secondary | ICD-10-CM

## 2020-02-08 DIAGNOSIS — O1495 Unspecified pre-eclampsia, complicating the puerperium: Secondary | ICD-10-CM

## 2020-02-08 NOTE — Progress Notes (Signed)
   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  She is currently monitored for the following issues for this low-risk pregnancy and has Migraines; Chronic headache disorder; Cervicocranial syndrome; Osteoarthritis; Rh negative state in antepartum period; Heart burn; History of macrosomia in infant in prior pregnancy, currently pregnant; Abnormal gallbladder ultrasound; Severe preeclampsia in postpartum period; Supervision of high risk pregnancy, antepartum; GAD (generalized anxiety disorder); Allergic rhinosinusitis; LGA (large for gestational age) fetus affecting management of mother; and Fetal arrhythmia affecting pregnancy, antepartum on their problem list.  Patient reports no complaints.  Contractions: Irregular. Vag. Bleeding: None.  Movement: Present. Denies leaking of fluid.   The following portions of the patient's history were reviewed and updated as appropriate: allergies, current medications, past family history, past medical history, past social history, past surgical history and problem list. Problem list updated.  Objective:   Vitals:   02/08/20 0956  BP: 127/74  Pulse: 71  Weight: 95 kg    Fetal Status: Fetal Heart Rate (bpm): 142 Fundal Height: 38 cm Movement: Present  Presentation: Vertex  General:  Alert, oriented and cooperative. Patient is in no acute distress.  Skin: Skin is warm and dry. No rash noted.   Cardiovascular: Normal heart rate noted  Respiratory: Normal respiratory effort, no problems with respiration noted  Abdomen: Soft, gravid, appropriate for gestational age.  Pain/Pressure: Present     Pelvic: Cervical exam performed Dilation: 1 Effacement (%): Thick Station: Ballotable  Extremities: Normal range of motion.  Edema: Trace  Mental Status: Normal mood and affect. Normal behavior. Normal judgment and thought content.   Assessment and Plan:  Pregnancy: [id number] at [redacted]w[redacted]d  1. Encounter for supervision of other normal pregnancy in third trimester - No concerning findings today - Culture, beta strep (group b only) - GC/Chlamydia probe amp ([place])not at [facility]  2. Severe preeclampsia in postpartum period - Normotensive this pregnancy - Continue bASA 81 mg daily  3. Excessive fetal growth affecting management of pregnancy in third trimester, single or unspecified fetus - F/u MFM scan 02/16/2020  Preterm labor symptoms and general obstetric precautions including but not limited to vaginal bleeding, contractions, leaking of fluid and fetal movement were reviewed in detail with the patient. Please refer to After Visit Summary for other counseling recommendations.  No follow-ups on file.  Future Appointments  Date Time Provider [place]  02/14/2020  8:15 AM Donnamae Jude, MD [facility]  02/16/2020  8:45 AM [place] NURSE [place] [facility]-US  02/16/2020  8:45 AM [place] 2 [facility] MFC-US    Darlina Rumpf, CNM

## 2020-02-08 NOTE — Patient Instructions (Signed)

## 2020-02-08 NOTE — Progress Notes (Signed)
Doing ok.

## 2020-02-09 LAB — GC/CHLAMYDIA PROBE AMP (~~LOC~~) NOT AT ARMC
Chlamydia: NEGATIVE
Comment: NEGATIVE
Comment: NORMAL
Neisseria Gonorrhea: NEGATIVE

## 2020-02-12 LAB — CULTURE, BETA STREP (GROUP B ONLY): Strep Gp B Culture: NEGATIVE

## 2020-02-14 ENCOUNTER — Telehealth (INDEPENDENT_AMBULATORY_CARE_PROVIDER_SITE_OTHER): Payer: 59 | Admitting: Family Medicine

## 2020-02-14 ENCOUNTER — Other Ambulatory Visit: Payer: Self-pay

## 2020-02-14 VITALS — BP 112/63

## 2020-02-14 DIAGNOSIS — Z3A37 37 weeks gestation of pregnancy: Secondary | ICD-10-CM

## 2020-02-14 DIAGNOSIS — O0993 Supervision of high risk pregnancy, unspecified, third trimester: Secondary | ICD-10-CM

## 2020-02-14 DIAGNOSIS — O0990 Supervision of high risk pregnancy, unspecified, unspecified trimester: Secondary | ICD-10-CM

## 2020-02-14 NOTE — Progress Notes (Signed)
TELEHEALTH OBSTETRICS PRENATAL VIRTUAL VIDEO VISIT ENCOUNTER NOTE  Provider location: [facility] for [place] [facility]   I connected with Carol [name] on 02/14/20 at  8:15 AM EST by MyChart Video Encounter at home and verified that I am speaking with the correct person using two identifiers.   I discussed the limitations, risks, security and privacy concerns of performing an evaluation and management service virtually and the availability of in person appointments. I also discussed with the patient that there may be a patient responsible charge related to this service. The patient expressed understanding and agreed to proceed. Subjective:  Carol [name] is a 34 y.o. [phone] at [redacted]w[redacted]d being seen today for ongoing prenatal care.  She is currently monitored for the following issues for this low-risk pregnancy and has Migraines; Chronic headache disorder; Cervicocranial syndrome; Osteoarthritis; Rh negative state in antepartum period; Heart burn; History of macrosomia in infant in prior pregnancy, currently pregnant; Abnormal gallbladder ultrasound; Severe preeclampsia in postpartum period; Supervision of high risk pregnancy, antepartum; GAD (generalized anxiety disorder); Allergic rhinosinusitis; LGA (large for gestational age) fetus affecting management of mother; and Fetal arrhythmia affecting pregnancy, antepartum on their problem list.  Patient reports no complaints.  Contractions: Not present. Vag. Bleeding: None.  Movement: Present. Denies any leaking of fluid.   The following portions of the patient's history were reviewed and updated as appropriate: allergies, current medications, past family history, past medical history, past social history, past surgical history and problem list.   Objective:   Vitals:   02/14/20 0808  BP: 112/63    Fetal Status:     Movement: Present     General:  Alert, oriented and cooperative. Patient is in no acute distress.    Respiratory: Normal respiratory effort, no problems with respiration noted  Mental Status: Normal mood and affect. Normal behavior. Normal judgment and thought content.  Rest of physical exam deferred due to type of encounter  Imaging: [place] MFM OB FOLLOW UP  Result Date: 01/16/2020 ----------------------------------------------------------------------  OBSTETRICS REPORT                       (Signed Final 01/16/2020 09:54 am) ---------------------------------------------------------------------- Patient Info  ID #:       [id number]                          D.O.B.:  09-20-1986 (33 yrs)  Name:       Carol [name]             Visit Date: 01/16/2020 09:14 am ---------------------------------------------------------------------- Performed By  Performed By:     Novella Rob        Ref. Address:     [place]  Attending:        Johnell Comings MD         [facility]  Referred By:      [facility] ---------------------------------------------------------------------- Orders   #  Description                          Code         Ordered By   1  [place] MFM OB FOLLOW UP                  [phone]     [place]  ----------------------------------------------------------------------   #  Order #                    Accession #                 Episode #   1  [id number]                  [id number]                  [id number]  ---------------------------------------------------------------------- Indications   Poor obstetric history: Prior fetal            O09.299   macrosomia, prior preeclampsia (ASA)   [redacted] weeks gestation of pregnancy                Z3A.33   Rh negative state in antepartum                O36.0190  ---------------------------------------------------------------------- Vital Signs  Weight (lb): 202                                Height:        5'7"  BMI:         31.63 ---------------------------------------------------------------------- Fetal Evaluation  Num Of Fetuses:         1  Cardiac Activity:       Observed  Presentation:           Cephalic  Placenta:               Anterior  P. Cord Insertion:      Previously Visualized  Amniotic Fluid  AFI FV:      Within normal limits  AFI Sum(cm)     %Tile       Largest Pocket(cm)  24.95           95          8.06  RUQ(cm)       RLQ(cm)       LUQ(cm)        LLQ(cm)  5.92          8.06          7.73           3.24 ---------------------------------------------------------------------- Biometry  BPD:      85.1  mm     G. Age:  34w 2d         77  %    CI:         77.1   %    70 - 86                                                          FL/HC:  21.7   %    19.9 - 21.5  HC:      306.9  mm     G. Age:  34w 1d         40  %    HC/AC:      0.98        0.96 - 1.11  AC:       313   mm     G. Age:  35w 2d         95  %    FL/BPD:     78.1   %    71 - 87  FL:       66.5  mm     G. Age:  34w 2d         68  %    FL/AC:      21.2   %    20 - 24  HUM:      57.5  mm     G. Age:  33w 2d         64  %  LV:          4  mm  Est. FW:    2508  gm      5 lb 8 oz     87  % ---------------------------------------------------------------------- OB History  Gravidity:    4         Term:   2        Prem:   0        SAB:   1  TOP:          9       Ectopic:  9        Living: 2 ---------------------------------------------------------------------- Gestational Age  LMP:           31w 2d        Date:  06/11/19                 EDD:   03/17/20  U/S Today:     34w 4d                                        EDD:   02/23/20  Best:          33w 1d     Det. By:  Previous Ultrasound      EDD:   03/04/20                                      (10/24/19) ---------------------------------------------------------------------- Anatomy  Cranium:               Appears normal         Aortic Arch:            Previously seen  Cavum:                  Appears normal         Ductal Arch:            Previously seen  Ventricles:            Appears normal         Diaphragm:              Previously seen  Choroid Plexus:  Previously seen        Stomach:                Appears normal, left                                                                        sided  Cerebellum:            Previously seen        Abdomen:                Appears normal  Posterior Fossa:       Previously seen        Abdominal Wall:         Previously seen  Nuchal Fold:           Previously seen        Cord Vessels:           Previously seen  Face:                  Orbits and profile     Kidneys:                Appear normal                         previously seen  Lips:                  Previously seen        Bladder:                Appears normal  Thoracic:              Appears normal         Spine:                  Previously seen  Heart:                 Previously seen        Upper Extremities:      Previously seen  RVOT:                  Previously seen        Lower Extremities:      Previously seen  LVOT:                  Previously seen  Other:  Heels/feet and open hands/5th digits prev visualized. Nasal bone          prev visualized. ---------------------------------------------------------------------- Cervix Uterus Adnexa  Cervix  Not visualized (advanced GA >24wks) ---------------------------------------------------------------------- Comments  This patient was seen for a follow up growth scan due to a  history of a prior macrosomic baby.  She reports that one of  her previous children was delivered at term weighing about 8-  1/2 pounds.  She reports that she has screened negative for  gestational diabetes and denies any problems since her last  exam.  She was informed that the fetal growth and amniotic fluid  level appears appropriate for her gestational age.  Based on  the EFW obtained today, it is highly likely that this baby will  most likely weigh around  8-1/2 pounds should she deliver at  around 39 weeks.  A follow up exam was scheduled in 4 weeks. ----------------------------------------------------------------------                   Johnell Comings, MD Electronically Signed Final Report   01/16/2020 09:54 am ----------------------------------------------------------------------   Assessment and Plan:  Pregnancy: [id number] at [redacted]w[redacted]d 1. Supervision of high risk pregnancy, antepartum Having some pelvic pressure Requests IOL at 39 wks, will schedule   Preterm labor symptoms and general obstetric precautions including but not limited to vaginal bleeding, contractions, leaking of fluid and fetal movement were reviewed in detail with the patient. I discussed the assessment and treatment plan with the patient. The patient was provided an opportunity to ask questions and all were answered. The patient agreed with the plan and demonstrated an understanding of the instructions. The patient was advised to call back or seek an in-person office evaluation/go to MAU at [facility] for any urgent or concerning symptoms. Please refer to After Visit Summary for other counseling recommendations.   I provided 7 minutes of face-to-face time during this encounter.  Return in 1 week (on 02/21/2020) for virtual.  Future Appointments  Date Time Provider [place]  02/16/2020  8:45 AM [place] MFC-US  02/16/2020  8:45 AM [place] 2 WH-MFCUS MFC-US  02/23/2020  8:30 AM Aletha Halim, MD [facility]    Donnamae Jude, MD Center for [facility], [place]

## 2020-02-14 NOTE — Patient Instructions (Signed)

## 2020-02-16 ENCOUNTER — Encounter (HOSPITAL_COMMUNITY): Payer: Self-pay

## 2020-02-16 ENCOUNTER — Other Ambulatory Visit: Payer: Self-pay

## 2020-02-16 ENCOUNTER — Encounter (HOSPITAL_COMMUNITY): Payer: Self-pay | Admitting: *Deleted

## 2020-02-16 ENCOUNTER — Telehealth (HOSPITAL_COMMUNITY): Payer: Self-pay | Admitting: *Deleted

## 2020-02-16 ENCOUNTER — Ambulatory Visit (HOSPITAL_COMMUNITY): Payer: 59 | Admitting: *Deleted

## 2020-02-16 ENCOUNTER — Ambulatory Visit (HOSPITAL_COMMUNITY)
Admission: RE | Admit: 2020-02-16 | Discharge: 2020-02-16 | Disposition: A | Discharge status: Home / Self Care | Payer: 59 | Source: Ambulatory Visit | Attending: Obstetrics and Gynecology | Admitting: Obstetrics and Gynecology

## 2020-02-16 VITALS — BP 122/71 | HR 88 | Temp 97.6°F

## 2020-02-16 DIAGNOSIS — O09293 Supervision of pregnancy with other poor reproductive or obstetric history, third trimester: Secondary | ICD-10-CM

## 2020-02-16 DIAGNOSIS — Z3A37 37 weeks gestation of pregnancy: Secondary | ICD-10-CM

## 2020-02-16 DIAGNOSIS — O360190 Maternal care for anti-D [Rh] antibodies, unspecified trimester, not applicable or unspecified: Secondary | ICD-10-CM

## 2020-02-16 DIAGNOSIS — O0990 Supervision of high risk pregnancy, unspecified, unspecified trimester: Secondary | ICD-10-CM

## 2020-02-16 NOTE — Telephone Encounter (Signed)
Preadmission screen  

## 2020-02-18 ENCOUNTER — Other Ambulatory Visit: Payer: Self-pay | Admitting: Obstetrics and Gynecology

## 2020-02-18 DIAGNOSIS — O1495 Unspecified pre-eclampsia, complicating the puerperium: Secondary | ICD-10-CM

## 2020-02-22 ENCOUNTER — Other Ambulatory Visit: Payer: Self-pay | Admitting: Advanced Practice Midwife

## 2020-02-23 ENCOUNTER — Other Ambulatory Visit: Payer: Self-pay

## 2020-02-23 ENCOUNTER — Ambulatory Visit (INDEPENDENT_AMBULATORY_CARE_PROVIDER_SITE_OTHER): Payer: 59 | Admitting: Obstetrics and Gynecology

## 2020-02-23 VITALS — BP 127/77 | HR 99 | Wt 211.0 lb

## 2020-02-23 DIAGNOSIS — O0990 Supervision of high risk pregnancy, unspecified, unspecified trimester: Secondary | ICD-10-CM

## 2020-02-23 DIAGNOSIS — O3663X0 Maternal care for excessive fetal growth, third trimester, not applicable or unspecified: Secondary | ICD-10-CM

## 2020-02-23 DIAGNOSIS — Z3A38 38 weeks gestation of pregnancy: Secondary | ICD-10-CM

## 2020-02-23 DIAGNOSIS — O368390 Maternal care for abnormalities of the fetal heart rate or rhythm, unspecified trimester, not applicable or unspecified: Secondary | ICD-10-CM

## 2020-02-23 DIAGNOSIS — O368330 Maternal care for abnormalities of the fetal heart rate or rhythm, third trimester, not applicable or unspecified: Secondary | ICD-10-CM

## 2020-02-23 NOTE — Progress Notes (Signed)
Prenatal Visit Note Date: 02/23/2020 Clinic: [facility]  Subjective:  Carol [name] is a 34 y.o. [id number] at [redacted]w[redacted]d being seen today for ongoing prenatal care.  She is currently monitored for the following issues for this high-risk pregnancy and has Migraines; Chronic headache disorder; Cervicocranial syndrome; Osteoarthritis; Rh negative state in antepartum period; Heart burn; History of macrosomia in infant in prior pregnancy, currently pregnant; Abnormal gallbladder ultrasound; Severe preeclampsia in postpartum period; Supervision of high risk pregnancy, antepartum; GAD (generalized anxiety disorder); Allergic rhinosinusitis; LGA (large for gestational age) fetus affecting management of mother; and Fetal arrhythmia affecting pregnancy, antepartum on their problem list.  Patient reports had some ? decreased FM this morning so changed to virtual visit but normal FM now.   Contractions: Irregular. Vag. Bleeding: None.  Movement: Present. Denies leaking of fluid.   The following portions of the patient's history were reviewed and updated as appropriate: allergies, current medications, past family history, past medical history, past social history, past surgical history and problem list. Problem list updated.  Objective:   Vitals:   02/23/20 0832  BP: 127/77  Pulse: 99  Weight: 211 lb (95.7 kg)    Fetal Status: Fetal Heart Rate (bpm): 137   Movement: Present  Presentation: Vertex  General:  Alert, oriented and cooperative. Patient is in no acute distress.  Skin: Skin is warm and dry. No rash noted.   Cardiovascular: Normal heart rate noted  Respiratory: Normal respiratory effort, no problems with respiration noted  Abdomen: Soft, gravid, appropriate for gestational age. Pain/Pressure: Present     Pelvic:  Cervical exam deferred        Extremities: Normal range of motion.  Edema: Mild pitting, slight indentation  Mental Status: Normal mood and affect. Normal  behavior. Normal judgment and thought content.   Urinalysis:      Assessment and Plan:  Pregnancy: [id number] at [redacted]w[redacted]d  1. Supervision of high risk pregnancy, antepartum Routine care For IOL this weekend  2. Excessive fetal growth affecting management of pregnancy in third trimester, single or unspecified fetus Follow labor curve closely   Preterm labor symptoms and general obstetric precautions including but not limited to vaginal bleeding, contractions, leaking of fluid and fetal movement were reviewed in detail with the patient. Please refer to After Visit Summary for other counseling recommendations.  No follow-ups on file.   Aletha Halim, MD

## 2020-02-24 ENCOUNTER — Other Ambulatory Visit (HOSPITAL_COMMUNITY)
Admission: RE | Admit: 2020-02-24 | Discharge: 2020-02-24 | Disposition: A | Discharge status: Home / Self Care | Payer: 59 | Source: Ambulatory Visit | Attending: Family Medicine | Admitting: Family Medicine

## 2020-02-24 DIAGNOSIS — Z01812 Encounter for preprocedural laboratory examination: Secondary | ICD-10-CM | POA: Insufficient documentation

## 2020-02-24 DIAGNOSIS — Z20822 Contact with and (suspected) exposure to covid-19: Secondary | ICD-10-CM | POA: Insufficient documentation

## 2020-02-24 LAB — SARS CORONAVIRUS 2 (TAT 6-24 HRS): SARS Coronavirus 2: NEGATIVE

## 2020-02-25 NOTE — H&P (Addendum)
OBSTETRIC ADMISSION HISTORY AND PHYSICAL  Carol [name] is a 34 y.o. female [phone] with IUP at [redacted]w[redacted]d by LMP presenting for IOL for macrosomia. She reports +FMs, No LOF, no VB, no blurry vision, headaches or peripheral edema, and RUQ pain.  She plans on breast feeding. Vasectomy for birth control. She received her prenatal care at  [facility]    Dating: By LMP --->  Estimated Date of Delivery: 03/04/20  Sono:   @[redacted]w[redacted]d , CWD, normal anatomy, cephalic presentation, [id number], 98% EFW  Prenatal History/Complications: -Migraines/chronic headaches -Rh neg -Hx severe preE in previous preg after delivery -GAD -Fetal arrhythmia -Hx macrosomia in previous preg  Past Medical History: Past Medical History:  Diagnosis Date   Anxiety    Depression    doing ok   Migraines    Vaginal Pap smear, abnormal    cryo therapy, ok since    Past Surgical History: Past Surgical History:  Procedure Laterality Date   CRYOTHERAPY  2012    Obstetrical History: OB History     Gravida  4   Para  2   Term  2   Preterm      AB  1   Living  2      SAB  1   TAB      Ectopic      Multiple  0   Live Births  2           Social History Social History   Socioeconomic History   Marital status: Married    Spouse name: Not on file   Number of children: 2   Years of education: Not on file   Highest education level: Not on file  Occupational History   Not on file  Tobacco Use   Smoking status: Former Smoker   Smokeless tobacco: Never Used   Tobacco comment: prior to first preg  Substance and Sexual Activity   Alcohol use: No   Drug use: No   Sexual activity: Yes    Birth control/protection: Surgical    Comment: vasectomy  Other Topics Concern   Not on file  Social History Narrative   Not on file   Social Determinants of Health   Financial Resource Strain:    Difficulty of Paying Living Expenses:   Food Insecurity:    Worried About Charity fundraiser in the Last Year:     Arboriculturist in the Last Year:   Transportation Needs:    Film/video editor (Medical):    Lack of Transportation (Non-Medical):   Physical Activity:    Days of Exercise per Week:    Minutes of Exercise per Session:   Stress:    Feeling of Stress :   Social Connections:    Frequency of Communication with Friends and Family:    Frequency of Social Gatherings with Friends and Family:    Attends Religious Services:    Active Member of Clubs or Organizations:    Attends Music therapist:    Marital Status:     Family History: Family History  Problem Relation Age of Onset   Lung disease Mother    Asthma Mother    Cancer Mother        skin   Gout Father    Heart disease Father        a-fib   Cancer Maternal Grandmother        breast, pancreatic   Cancer Maternal Grandfather  colon   Heart disease Paternal Grandfather     Allergies: Allergies  Allergen Reactions   Codeine Nausea And Vomiting    Medications Prior to Admission  Medication Sig Dispense Refill Last Dose   acetaminophen (TYLENOL) 500 MG tablet Take 500 mg by mouth every 6 (six) hours as needed for moderate pain.    02/25/2020 at Unknown time   ASPIRIN LOW DOSE 81 MG EC tablet TAKE 1 TABLET BY MOUTH EVERY DAY 30 tablet 0 02/25/2020 at Unknown time   FLUoxetine (PROZAC) 10 MG tablet Take 20 mg by mouth daily.    02/25/2020 at Unknown time   omeprazole (PRILOSEC) 20 MG capsule Take 1 capsule (20 mg total) by mouth 2 (two) times daily before a meal. 60 capsule 3 Past Month at Unknown time   Prenatal Vit-Fe Fumarate-FA (PRENATAL MULTIVITAMIN) TABS tablet Take 1 tablet by mouth daily at 12 noon.   02/25/2020 at Unknown time   promethazine (PHENERGAN) 25 MG tablet TAKE 1 TABLET (25 MG TOTAL) BY MOUTH EVERY 6 (SIX) HOURS AS NEEDED FOR NAUSEA OR VOMITING. 30 tablet 1 02/25/2020 at Unknown time   cyclobenzaprine (FLEXERIL) 10 MG tablet Take 1 tablet (10 mg total) by mouth 3 (three) times daily as  needed for muscle spasms. 15 tablet 0 More than a month at Unknown time   promethazine (PHENERGAN) 25 MG tablet Take 1 tablet (25 mg total) by mouth every 6 (six) hours as needed for nausea or vomiting. 30 tablet 1      Review of Systems   All systems reviewed and negative except as stated in HPI  Blood pressure 123/71, pulse 88, temperature 98.7 F (37.1 C), temperature source Oral, height 5\' 7"  (1.702 m), weight 95.9 kg, last menstrual period 06/11/2019, currently breastfeeding. General appearance: alert, cooperative and no distress Lungs: clear to auscultation bilaterally Heart: regular rate and rhythm Abdomen: soft, non-tender; bowel sounds normal Pelvic: Deferred Extremities: Homans sign is negative, no sign of DVT Presentation: cephalic Fetal monitoringBaseline: 120 bpm, Variability: Good {> 6 bpm) and Accelerations: Reactive Uterine activity no contractions Dilation: 1.5 Effacement (%): 50 Station: -2 Exam by:: Dr. Darene Lamer   Prenatal labs: ABO, Rh: O/Negative/-- (09/15 0933) Antibody: Negative (01/05 0826) Rubella: 1.69 (09/15 0933) RPR: Non Reactive (01/05 0826)  HBsAg: Negative (09/15 0933)  HIV: Non Reactive (01/05 0826)  GBS: Negative/-- (03/03 1017)  2 hr Glucola wnl Genetic screening  NIPS low risk Anatomy US normal  Prenatal Transfer Tool  Maternal Diabetes: No Genetic Screening: Normal Maternal Ultrasounds/Referrals: Normal Fetal Ultrasounds or other Referrals:  None Maternal Substance Abuse:  No Significant Maternal Medications:  Meds include: Prozac Significant Maternal Lab Results: Group B Strep negative and Rh negative  Results for orders placed or performed during the hospital encounter of 02/26/20 (from the past 24 hour(s))  CBC   Collection Time: 02/26/20 12:32 AM  Result Value Ref Range   WBC 12.3 (H) 4.0 - 10.5 K/uL   RBC 3.97 3.87 - 5.11 MIL/uL   Hemoglobin 12.0 12.0 - 15.0 g/dL   HCT 35.5 (L) 36.0 - 46.0 %   MCV 89.4 80.0 - 100.0 fL    MCH 30.2 26.0 - 34.0 pg   MCHC 33.8 30.0 - 36.0 g/dL   RDW 13.6 11.5 - 15.5 %   Platelets 354 150 - 400 K/uL   nRBC 0.0 0.0 - 0.2 %    Patient Active Problem List   Diagnosis Date Noted   Encounter for induction of labor 02/26/2020  LGA (large for gestational age) fetus affecting management of mother 11/01/2019   Fetal arrhythmia affecting pregnancy, antepartum 11/01/2019   GAD (generalized anxiety disorder) 09/26/2019   Allergic rhinosinusitis 09/26/2019   Severe preeclampsia in postpartum period 08/23/2019   Supervision of high risk pregnancy, antepartum 08/23/2019   Abnormal gallbladder ultrasound 04/26/2018   History of macrosomia in infant in prior pregnancy, currently pregnant 04/14/2018   Heart burn 02/03/2018   Rh negative state in antepartum period 09/28/2017   Migraines    Chronic headache disorder 04/22/2017   Cervicocranial syndrome 04/22/2017   Osteoarthritis 04/22/2017    Assessment/Plan:  Carol [name] is a 34 y.o. [id number] at [redacted]w[redacted]d here for elective IOL for fetal macrosomia.  #Labor: Bishop score 4. Vaginal cytotec placed for cervical ripening. Patient refuses FB during this induction, as she had a bad experience with one during her last induction. Consider cytotec v pitocin at next check. #Pain: Analgesia PRN #FWB: Cat I, EFW 8#10 by Leopold's #ID: GBS neg #MOF: Breast #MOC:Vasectomy #Circ:  Yes (inpatient) if boy #Rh neg: Rhogam given 12/13/19. Eval after delivery. #Hx of PreE in previous pregnancy: Current Bps 123-141/71-82. Will check PreE labs.  Lurline Del, DO  02/26/2020, 1:25 AM  GME ATTESTATION:  I saw and evaluated the patient. I agree with the findings and the plan of care as documented in the resident's note.  Merilyn Baba, DO OB Fellow, [place] [facility] 02/26/2020 1:34 AM

## 2020-02-26 ENCOUNTER — Inpatient Hospital Stay (HOSPITAL_COMMUNITY)
Admission: AD | Admit: 2020-02-26 | Discharge: 2020-02-28 | DRG: 807 | Disposition: A | Discharge status: Home / Self Care | Payer: 59 | Attending: Obstetrics and Gynecology | Admitting: Obstetrics and Gynecology

## 2020-02-26 ENCOUNTER — Encounter (HOSPITAL_COMMUNITY): Payer: Self-pay | Admitting: Obstetrics & Gynecology

## 2020-02-26 ENCOUNTER — Inpatient Hospital Stay (HOSPITAL_COMMUNITY): Payer: 59 | Admitting: Anesthesiology

## 2020-02-26 ENCOUNTER — Other Ambulatory Visit: Payer: Self-pay

## 2020-02-26 ENCOUNTER — Inpatient Hospital Stay (HOSPITAL_COMMUNITY): Payer: 59

## 2020-02-26 DIAGNOSIS — Z87891 Personal history of nicotine dependence: Secondary | ICD-10-CM

## 2020-02-26 DIAGNOSIS — O26893 Other specified pregnancy related conditions, third trimester: Secondary | ICD-10-CM | POA: Diagnosis present

## 2020-02-26 DIAGNOSIS — O3663X0 Maternal care for excessive fetal growth, third trimester, not applicable or unspecified: Principal | ICD-10-CM | POA: Diagnosis present

## 2020-02-26 DIAGNOSIS — O99344 Other mental disorders complicating childbirth: Secondary | ICD-10-CM | POA: Diagnosis present

## 2020-02-26 DIAGNOSIS — F411 Generalized anxiety disorder: Secondary | ICD-10-CM | POA: Diagnosis present

## 2020-02-26 DIAGNOSIS — Z3A38 38 weeks gestation of pregnancy: Secondary | ICD-10-CM

## 2020-02-26 DIAGNOSIS — Z3490 Encounter for supervision of normal pregnancy, unspecified, unspecified trimester: Secondary | ICD-10-CM | POA: Diagnosis present

## 2020-02-26 DIAGNOSIS — O368390 Maternal care for abnormalities of the fetal heart rate or rhythm, unspecified trimester, not applicable or unspecified: Secondary | ICD-10-CM | POA: Diagnosis present

## 2020-02-26 DIAGNOSIS — Z6791 Unspecified blood type, Rh negative: Secondary | ICD-10-CM | POA: Diagnosis not present

## 2020-02-26 DIAGNOSIS — O0990 Supervision of high risk pregnancy, unspecified, unspecified trimester: Secondary | ICD-10-CM

## 2020-02-26 DIAGNOSIS — O09299 Supervision of pregnancy with other poor reproductive or obstetric history, unspecified trimester: Secondary | ICD-10-CM

## 2020-02-26 DIAGNOSIS — Z20822 Contact with and (suspected) exposure to covid-19: Secondary | ICD-10-CM | POA: Diagnosis present

## 2020-02-26 LAB — COMPREHENSIVE METABOLIC PANEL
ALT: 13 U/L (ref 0–44)
AST: 19 U/L (ref 15–41)
Albumin: 2.8 g/dL — ABNORMAL LOW (ref 3.5–5.0)
Alkaline Phosphatase: 89 U/L (ref 38–126)
Anion gap: 12 (ref 5–15)
BUN: 5 mg/dL — ABNORMAL LOW (ref 6–20)
CO2: 21 mmol/L — ABNORMAL LOW (ref 22–32)
Calcium: 9.7 mg/dL (ref 8.9–10.3)
Chloride: 104 mmol/L (ref 98–111)
Creatinine, Ser: 0.52 mg/dL (ref 0.44–1.00)
GFR calc Af Amer: >60 mL/min (ref >60)
GFR calc non Af Amer: >60 mL/min (ref >60)
Glucose, Bld: 86 mg/dL (ref 70–99)
Potassium: 3 mmol/L — ABNORMAL LOW (ref 3.5–5.1)
Sodium: 137 mmol/L (ref 135–145)
Total Bilirubin: 0.6 mg/dL (ref 0.3–1.2)
Total Protein: 5.8 g/dL — ABNORMAL LOW (ref 6.5–8.1)

## 2020-02-26 LAB — CBC
HCT: 35.5 % — ABNORMAL LOW (ref 36.0–46.0)
Hemoglobin: 12 g/dL (ref 12.0–15.0)
MCH: 30.2 pg (ref 26.0–34.0)
MCHC: 33.8 g/dL (ref 30.0–36.0)
MCV: 89.4 fL (ref 80.0–100.0)
Platelets: 354 10*3/uL (ref 150–400)
RBC: 3.97 MIL/uL (ref 3.87–5.11)
RDW: 13.6 % (ref 11.5–15.5)
WBC: 12.3 10*3/uL — ABNORMAL HIGH (ref 4.0–10.5)
nRBC: 0 % (ref 0.0–0.2)

## 2020-02-26 LAB — PROTEIN / CREATININE RATIO, URINE
Creatinine, Urine: 78.01 mg/dL
Protein Creatinine Ratio: 0.21 mg/mg{Cre} — ABNORMAL HIGH (ref 0.00–0.15)
Total Protein, Urine: 16 mg/dL

## 2020-02-26 LAB — TYPE AND SCREEN
ABO/RH(D): O NEG
Antibody Screen: POSITIVE

## 2020-02-26 LAB — RPR: RPR Ser Ql: NONREACTIVE

## 2020-02-26 MED ORDER — SIMETHICONE 80 MG PO CHEW: 80.0000 mg | CHEWABLE_TABLET | ORAL | Status: DC | PRN

## 2020-02-26 MED ORDER — MISOPROSTOL 50MCG HALF TABLET
ORAL_TABLET | ORAL | Status: AC
  Filled 2020-02-26: qty 1

## 2020-02-26 MED ORDER — FENTANYL-BUPIVACAINE-NACL 0.5-0.125-0.9 MG/250ML-% EP SOLN
12.0000 mL/h | EPIDURAL | Status: DC | PRN
  Filled 2020-02-26: qty 250

## 2020-02-26 MED ORDER — PHENYLEPHRINE 40 MCG/ML (10ML) SYRINGE FOR IV PUSH (FOR BLOOD PRESSURE SUPPORT)
80.0000 ug | PREFILLED_SYRINGE | INTRAVENOUS | Status: DC | PRN
  Filled 2020-02-26: qty 10

## 2020-02-26 MED ORDER — IBUPROFEN 600 MG PO TABS
600.0000 mg | ORAL_TABLET | Freq: Four times a day (QID) | ORAL | Status: DC
  Administered 2020-02-26 – 2020-02-28 (×7): 600 mg via ORAL
  Filled 2020-02-26 (×7): qty 1

## 2020-02-26 MED ORDER — ONDANSETRON HCL 4 MG PO TABS: 4.0000 mg | ORAL_TABLET | ORAL | Status: DC | PRN

## 2020-02-26 MED ORDER — WITCH HAZEL-GLYCERIN EX PADS: 1.0000 "application " | MEDICATED_PAD | CUTANEOUS | Status: DC | PRN

## 2020-02-26 MED ORDER — MAGNESIUM HYDROXIDE 400 MG/5ML PO SUSP: 30.0000 mL | ORAL | Status: DC | PRN

## 2020-02-26 MED ORDER — FENTANYL CITRATE (PF) 100 MCG/2ML IJ SOLN
INTRAMUSCULAR | Status: AC
  Administered 2020-02-26: 100 ug via INTRAVENOUS
  Filled 2020-02-26: qty 2

## 2020-02-26 MED ORDER — MISOPROSTOL 50MCG HALF TABLET: 50.0000 ug | ORAL_TABLET | Freq: Once | ORAL | Status: AC

## 2020-02-26 MED ORDER — OXYTOCIN BOLUS FROM INFUSION
500.0000 mL | Freq: Once | INTRAVENOUS | Status: AC
  Administered 2020-02-26: 500 mL via INTRAVENOUS

## 2020-02-26 MED ORDER — ONDANSETRON HCL 4 MG/2ML IJ SOLN
4.0000 mg | Freq: Four times a day (QID) | INTRAMUSCULAR | Status: DC | PRN
  Administered 2020-02-26: 4 mg via INTRAVENOUS
  Filled 2020-02-26: qty 2

## 2020-02-26 MED ORDER — SODIUM CHLORIDE (PF) 0.9 % IJ SOLN
INTRAMUSCULAR | Status: DC | PRN
  Administered 2020-02-26: 12 mL/h via EPIDURAL

## 2020-02-26 MED ORDER — MISOPROSTOL 25 MCG QUARTER TABLET
25.0000 ug | ORAL_TABLET | ORAL | Status: DC | PRN
  Administered 2020-02-26: 25 ug via VAGINAL

## 2020-02-26 MED ORDER — OXYCODONE HCL 5 MG PO TABS: 5.0000 mg | ORAL_TABLET | ORAL | Status: DC | PRN

## 2020-02-26 MED ORDER — FENTANYL CITRATE (PF) 100 MCG/2ML IJ SOLN
100.0000 ug | INTRAMUSCULAR | Status: DC | PRN
  Administered 2020-02-26: 100 ug via INTRAVENOUS
  Filled 2020-02-26: qty 2

## 2020-02-26 MED ORDER — EPHEDRINE 5 MG/ML INJ: 10.0000 mg | INTRAVENOUS | Status: DC | PRN

## 2020-02-26 MED ORDER — OXYCODONE HCL 5 MG PO TABS: 10.0000 mg | ORAL_TABLET | ORAL | Status: DC | PRN

## 2020-02-26 MED ORDER — DIPHENHYDRAMINE HCL 25 MG PO CAPS: 25.0000 mg | ORAL_CAPSULE | Freq: Four times a day (QID) | ORAL | Status: DC | PRN

## 2020-02-26 MED ORDER — MISOPROSTOL 25 MCG QUARTER TABLET
ORAL_TABLET | ORAL | Status: AC
  Administered 2020-02-26: 50 ug via BUCCAL
  Filled 2020-02-26: qty 1

## 2020-02-26 MED ORDER — BENZOCAINE-MENTHOL 20-0.5 % EX AERO
1.0000 "application " | INHALATION_SPRAY | CUTANEOUS | Status: DC | PRN
  Filled 2020-02-26: qty 56

## 2020-02-26 MED ORDER — SENNOSIDES-DOCUSATE SODIUM 8.6-50 MG PO TABS
2.0000 | ORAL_TABLET | ORAL | Status: DC
  Administered 2020-02-26 – 2020-02-27 (×2): 2 via ORAL
  Filled 2020-02-26 (×2): qty 2

## 2020-02-26 MED ORDER — FLEET ENEMA 7-19 GM/118ML RE ENEM: 1.0000 | ENEMA | RECTAL | Status: DC | PRN

## 2020-02-26 MED ORDER — OXYTOCIN 40 UNITS IN NORMAL SALINE INFUSION - SIMPLE MED
2.5000 [IU]/h | INTRAVENOUS | Status: DC
  Administered 2020-02-26: 2.5 [IU]/h via INTRAVENOUS
  Filled 2020-02-26: qty 1000

## 2020-02-26 MED ORDER — LACTATED RINGERS IV SOLN
500.0000 mL | INTRAVENOUS | Status: DC | PRN
  Administered 2020-02-26: 500 mL via INTRAVENOUS

## 2020-02-26 MED ORDER — PRENATAL MULTIVITAMIN CH
1.0000 | ORAL_TABLET | Freq: Every day | ORAL | Status: DC
  Administered 2020-02-27 – 2020-02-28 (×2): 1 via ORAL
  Filled 2020-02-26 (×2): qty 1

## 2020-02-26 MED ORDER — ONDANSETRON HCL 4 MG/2ML IJ SOLN: 4.0000 mg | INTRAMUSCULAR | Status: DC | PRN

## 2020-02-26 MED ORDER — LACTATED RINGERS IV SOLN: INTRAVENOUS | Status: DC

## 2020-02-26 MED ORDER — DIBUCAINE (PERIANAL) 1 % EX OINT: 1.0000 "application " | TOPICAL_OINTMENT | CUTANEOUS | Status: DC | PRN

## 2020-02-26 MED ORDER — ACETAMINOPHEN 325 MG PO TABS: 650.0000 mg | ORAL_TABLET | ORAL | Status: DC | PRN

## 2020-02-26 MED ORDER — LIDOCAINE HCL (PF) 1 % IJ SOLN
30.0000 mL | INTRAMUSCULAR | Status: DC | PRN
  Filled 2020-02-26: qty 30

## 2020-02-26 MED ORDER — TERBUTALINE SULFATE 1 MG/ML IJ SOLN
0.2500 mg | Freq: Once | INTRAMUSCULAR | Status: DC | PRN
  Filled 2020-02-26: qty 1

## 2020-02-26 MED ORDER — TERBUTALINE SULFATE 1 MG/ML IJ SOLN: 0.2500 mg | Freq: Once | INTRAMUSCULAR | Status: DC | PRN

## 2020-02-26 MED ORDER — DIPHENHYDRAMINE HCL 50 MG/ML IJ SOLN: 12.5000 mg | INTRAMUSCULAR | Status: DC | PRN

## 2020-02-26 MED ORDER — OXYTOCIN 40 UNITS IN NORMAL SALINE INFUSION - SIMPLE MED
1.0000 m[IU]/min | INTRAVENOUS | Status: DC
  Administered 2020-02-26: 2 m[IU]/min via INTRAVENOUS

## 2020-02-26 MED ORDER — MISOPROSTOL 50MCG HALF TABLET
50.0000 ug | ORAL_TABLET | ORAL | Status: DC | PRN
  Administered 2020-02-26: 50 ug via ORAL

## 2020-02-26 MED ORDER — FLUOXETINE HCL 20 MG PO CAPS
20.0000 mg | ORAL_CAPSULE | Freq: Every day | ORAL | Status: DC
  Administered 2020-02-26 – 2020-02-27 (×2): 20 mg via ORAL
  Filled 2020-02-26 (×6): qty 1

## 2020-02-26 MED ORDER — LIDOCAINE HCL (PF) 1 % IJ SOLN
INTRAMUSCULAR | Status: DC | PRN
  Administered 2020-02-26: 6 mL via EPIDURAL

## 2020-02-26 MED ORDER — COCONUT OIL OIL
1.0000 "application " | TOPICAL_OIL | Status: DC | PRN
  Administered 2020-02-27: 1 via TOPICAL

## 2020-02-26 MED ORDER — PANTOPRAZOLE SODIUM 40 MG PO TBEC
40.0000 mg | DELAYED_RELEASE_TABLET | Freq: Every day | ORAL | Status: DC
  Administered 2020-02-26 – 2020-02-28 (×3): 40 mg via ORAL
  Filled 2020-02-26 (×3): qty 1

## 2020-02-26 MED ORDER — LACTATED RINGERS IV SOLN
500.0000 mL | Freq: Once | INTRAVENOUS | Status: AC
  Administered 2020-02-26: 500 mL via INTRAVENOUS

## 2020-02-26 MED ORDER — CALCIUM CARBONATE ANTACID 500 MG PO CHEW
1.0000 | CHEWABLE_TABLET | ORAL | Status: DC | PRN
  Administered 2020-02-26: 200 mg via ORAL
  Filled 2020-02-26: qty 1

## 2020-02-26 MED ORDER — PHENYLEPHRINE 40 MCG/ML (10ML) SYRINGE FOR IV PUSH (FOR BLOOD PRESSURE SUPPORT)
80.0000 ug | PREFILLED_SYRINGE | INTRAVENOUS | Status: AC | PRN
  Administered 2020-02-26 (×3): 80 ug via INTRAVENOUS

## 2020-02-26 MED ORDER — SOD CITRATE-CITRIC ACID 500-334 MG/5ML PO SOLN
30.0000 mL | ORAL | Status: DC | PRN
  Administered 2020-02-26 (×2): 30 mL via ORAL
  Filled 2020-02-26 (×2): qty 30

## 2020-02-26 NOTE — Lactation Note (Signed)
This note was copied from a baby's chart. Lactation Consultation Note Baby taken to CN for monitoring d/t grunting. RN getting mom up for toileting. [place] set up DEBP. Told mom LC would return when mom got settled.   Patient Name: Carol [name] [id number] Date: 02/26/2020     Maternal Data    Feeding Feeding Type: Breast Fed  LATCH Score Latch: Grasps breast easily, tongue down, lips flanged, rhythmical sucking.  Audible Swallowing: A few with stimulation  Type of Nipple: Everted at rest and after stimulation  Comfort (Breast/Nipple): Soft / non-tender  Hold (Positioning): Assistance needed to correctly position infant at breast and maintain latch.  LATCH Score: 8  Interventions    Lactation Tools Discussed/Used     Consult Status      Carol [name], Carol [name] 02/26/2020, 10:50 PM

## 2020-02-26 NOTE — Progress Notes (Signed)
Labor Progress Note Carol [name] is a 34 y.o. [id number] at [redacted]w[redacted]d presented for elective IOL for fetal macrosomia.  S:  No complaints at this time.  O:  BP 120/74   Pulse 79   Temp 98.3 F (36.8 C) (Oral)   Resp 16   Ht 5\' 7"  (1.702 m)   Wt 95.9 kg   LMP 06/11/2019 (Exact Date)   BMI 33.13 kg/m  EFM: 125/moderate/acels present Toco: q2-3 min  CVE: Dilation: 4.5 Effacement (%): 50 Station: Ballotable Presentation: Vertex Exam by:: Dr. Dione Plover   A&P: 34 y.o. [id number] [redacted]w[redacted]d here for elective IOL for fetal macrosomia. #Labor: S/p cytotec x 2 and FB. Cervical exam still somewhat thick so we will give another cytotec and consider pitocin at the next check. AROM as indicated.  #Pain: Analgesia PRN #FWB: Cat I #GBS negative #Rh neg: Rhogam given 1/5, eval after delivery Hx of PreE in previous pregnancy: BP's have been in the normal range. Pre-E labs normal.  [name], MD 9:11 AM

## 2020-02-26 NOTE — Anesthesia Preprocedure Evaluation (Addendum)
Anesthesia Evaluation  Patient identified by MRN, date of birth, ID band Patient awake    Reviewed: Allergy & Precautions, H&P , NPO status , Patient's Chart, lab work & pertinent test results, reviewed documented beta blocker date and time   Airway Mallampati: II  TM Distance: >3 FB Neck ROM: full    Dental no notable dental hx.    Pulmonary neg pulmonary ROS, former smoker,    Pulmonary exam normal breath sounds clear to auscultation       Cardiovascular hypertension, Normal cardiovascular exam Rhythm:regular Rate:Normal     Neuro/Psych negative neurological ROS  negative psych ROS   GI/Hepatic negative GI ROS, Neg liver ROS,   Endo/Other  negative endocrine ROS  Renal/GU negative Renal ROS  negative genitourinary   Musculoskeletal   Abdominal   Peds  Hematology negative hematology ROS (+)   Anesthesia Other Findings   Reproductive/Obstetrics (+) Pregnancy                            Anesthesia Physical Anesthesia Plan  ASA: II  Anesthesia Plan: Epidural   Post-op Pain Management:    Induction:   PONV Risk Score and Plan:   Airway Management Planned:   Additional Equipment:   Intra-op Plan:   Post-operative Plan:   Informed Consent: I have reviewed the patients History and Physical, chart, labs and discussed the procedure including the risks, benefits and alternatives for the proposed anesthesia with the patient or authorized representative who has indicated his/her understanding and acceptance.     Dental Advisory Given  Plan Discussed with: Anesthesiologist  Anesthesia Plan Comments: (Labs checked- platelets confirmed with RN in room. Fetal heart tracing, per RN, reported to be stable enough for sitting procedure. Discussed epidural, and patient consents to the procedure:  included risk of possible headache,backache, failed block, allergic reaction, and nerve injury.  This patient was asked if she had any questions or concerns before the procedure started.)        Anesthesia Quick Evaluation

## 2020-02-26 NOTE — Anesthesia Procedure Notes (Signed)
Epidural Patient location during procedure: OB Start time: 02/26/2020 1:08 PM End time: 02/26/2020 2:14 PM  Staffing Anesthesiologist: Janeece Riggers, MD  Preanesthetic Checklist Completed: patient identified, IV checked, site marked, risks and benefits discussed, surgical consent, monitors and equipment checked, pre-op evaluation and timeout performed  Epidural Patient position: sitting Prep: DuraPrep and site prepped and draped Patient monitoring: continuous pulse ox and blood pressure Approach: midline Location: L3-L4 Injection technique: LOR air  Needle:  Needle type: Tuohy  Needle gauge: 17 G Needle length: 9 cm and 9 Needle insertion depth: 6 cm Catheter type: closed end flexible Catheter size: 19 Gauge Catheter at skin depth: 11 cm Test dose: negative  Assessment Events: blood not aspirated, injection not painful, no injection resistance, no paresthesia and negative IV test

## 2020-02-26 NOTE — Lactation Note (Signed)
This note was copied from a baby's chart. Lactation Consultation Note Baby 3 hrs old. Has gone to CN for monitoring d/t grunting. Mom stated baby came out fast has a lot of facial bruising.  Mom stated baby BF great before going. Mom demonstrated hand expression w/colostrum easily expressed. Gave mom colostrum containers. Encouraged mom to collect colostrum to give to baby as desert after feeding d/t bruising to help keep bili levels down. If baby needs to be transferred to NICU mom can give colostrum to take to him.  Mom shown how to use DEBP & how to disassemble, clean, & reassemble parts. Mom knows to pump q3h for 15-20 min. Mom encouraged to feed baby 8-12 times/24 hours and with feeding cues. Mom encouraged to waken baby for feedings if baby hasn't cued in 3 hrs. Explained if baby gets jaundice he may be sleepy and need stimulated to feed.   Newborn behavior, feeding habits, STS, I&O, breast massage, supply and demand discussed.  Encouraged mom if needs assistance or questions to call. Lactation brochure given.  Patient Name: Carol [name] [id number] Date: 02/26/2020 Reason for consult: Initial assessment;Term   Maternal Data Has patient been taught Hand Expression?: Yes Does the patient have breastfeeding experience prior to this delivery?: Yes  Feeding Feeding Type: Breast Fed  LATCH Score Latch: Grasps breast easily, tongue down, lips flanged, rhythmical sucking.  Audible Swallowing: A few with stimulation  Type of Nipple: Everted at rest and after stimulation  Comfort (Breast/Nipple): Soft / non-tender  Hold (Positioning): Assistance needed to correctly position infant at breast and maintain latch.  LATCH Score: 8  Interventions Interventions: Breast feeding basics reviewed;DEBP;Breast compression;Hand express;Breast massage  Lactation Tools Discussed/Used WIC Program: No Pump Review: Setup, frequency, and cleaning;Milk Storage Initiated by:: Allayne Stack RN IBCLC Date initiated:: 02/26/20   Consult Status Consult Status: Follow-up Date: 02/27/20 Follow-up type: In-patient    Theodoro Kalata 02/26/2020, 11:13 PM

## 2020-02-26 NOTE — Progress Notes (Signed)
Labor Progress Note Carol [name] is a 34 y.o. [id number] at [redacted]w[redacted]d presented for elective IOL for fetal macrosomia.  S:  Comfortable with epidural, amenable to AROM  O:  BP (!) 100/48   Pulse 99   Temp 98.5 F (36.9 C) (Oral)   Resp 18   Ht 5\' 7"  (1.702 m)   Wt 95.9 kg   LMP 06/11/2019 (Exact Date)   SpO2 99%   BMI 33.13 kg/m  EFM: 125/moderate/acels present/variable decel Toco: q2-3 min  CVE: Dilation: 5.5 Effacement (%): 60 Station: -3 Presentation: Vertex Exam by:: SRussell, RN    By my exam just now 6/80/-1   A&P: 34 y.o. [id number] [redacted]w[redacted]d here for elective IOL at term w c/f fetal macrosomia. #Labor: S/p cytotec x3 and FB, started pitocin @ [id number] and now s/p AROM for clear with good fetal descent, cont pitocin. Anticipate NSVD #Pain: epidural #FWB: Cat 2. Overall reassuring given moderate variability and consistent accels.  #GBS negative #Rh neg: Rhogam given 1/5, eval after delivery Hx of PreE in previous pregnancy: BP's have been in the normal range. Pre-E labs normal.  Clarnce Flock, MD 5:42 PM

## 2020-02-26 NOTE — Progress Notes (Signed)
Labor Progress Note Carol [name] is a 34 y.o. [id number] at [redacted]w[redacted]d presented for elective IOL for fetal macrosomia.  S:  No complaints at this time. Feeling more comfortable with her epidural  O:  BP 102/64   Pulse 77   Temp 98.5 F (36.9 C) (Oral)   Resp 16   Ht 5\' 7"  (1.702 m)   Wt 95.9 kg   LMP 06/11/2019 (Exact Date)   SpO2 100%   BMI 33.13 kg/m  EFM: 140/moderate/acels present/variable decel Toco: q2-3 min  CVE: Dilation: 4.5 Effacement (%): 50 Station: Ballotable Presentation: Vertex Exam by:: Dr. Dione Plover   Per RN verbal report, 5-6/60/-3   A&P: 34 y.o. [id number] [redacted]w[redacted]d here for elective IOL for fetal macrosomia. #Labor: S/p cytotec x3 and FB. Start pitocin. AROM as indicated.  #Pain: Analgesia PRN #FWB: Cat 2. Overall reassuring given moderate variability and consistent accels.  #GBS negative #Rh neg: Rhogam given 1/5, eval after delivery Hx of PreE in previous pregnancy: BP's have been in the normal range. Pre-E labs normal.  [name], MD 2:09 PM

## 2020-02-26 NOTE — Progress Notes (Signed)
Pt arrived to [facility] floor, c/o heartburn and requesting prilosec as she was taking during pregnancy and tums. Len Blalock CNM called and to place orders.

## 2020-02-26 NOTE — Progress Notes (Signed)
Labor Progress Note Novena Paterson is a 34 y.o. [id number] at [redacted]w[redacted]d presented for elective IOL for fetal macrosomia. S:  No complaints at this time. O:  BP 109/68   Pulse 82   Temp 98.7 F (37.1 C) (Oral)   Ht 5\' 7"  (1.702 m)   Wt 95.9 kg   LMP 06/11/2019 (Exact Date)   BMI 33.13 kg/m  EFM: 120/moderate/acels present  CVE: Dilation: 1.5 Effacement (%): 50 Station: -2 Presentation: Vertex Exam by:: Dr. Darene Lamer    A&P: 34 y.o. [id number] [redacted]w[redacted]d here for elective IOL for fetal macrosomia. #Labor: S/p cytotec x 1. Patient amenable to FB due to lack of cervical change, placed easily at this time. 2nd cytotec given. #Pain: Analgesia PRN #FWB: Cat I #GBS negative #Rh neg: Rhogam given 1/5, eval after delivery Hx of PreE in previous pregnancy: Current Bps 109-123/68-71. Pre-E labs pending  Lurline Del, DO 4:58 AM

## 2020-02-26 NOTE — Discharge Summary (Signed)
Postpartum Discharge Summary    Patient Name: Carol [name] DOB: 31-Aug-1986 MRN: [id number]  Date of admission: 02/26/2020 Delivering Provider: Clarnce Flock   Date of discharge: 02/28/2020  Admitting diagnosis: Encounter for induction of labor [Z34.90] Intrauterine pregnancy: [redacted]w[redacted]d    Secondary diagnosis:  Active Problems:   Rh negative state in antepartum period   History of macrosomia in infant in prior pregnancy, currently pregnant   Supervision of high risk pregnancy, antepartum   GAD (generalized anxiety disorder)   Fetal arrhythmia affecting pregnancy, antepartum   Encounter for induction of labor  Additional problems:      Discharge diagnosis: Term Pregnancy Delivered                                                                                                Post partum procedures:rhogam  Augmentation: AROM, Pitocin, Cytotec and Foley Balloon  Complications: None  Hospital course:  Induction of Labor With Vaginal Delivery   34y.o. yo [id number] admitted to the hospital 02/26/2020 for induction of labor.  Indication for induction: elective at 39 weeks with concern for fetal macrosomia.  Patient had an uncomplicated labor course as follows: Membrane Rupture Time/Date: 5:36 PM ,02/26/2020   Intrapartum Procedures: Episiotomy: None [1]                                         Lacerations:  None [1]  Patient had delivery of a Viable infant.  Information for the patient's newborn:  StObie, Silos0[[id number]]Delivery Method: Vaginal, Spontaneous(Filed from Delivery Summary)    02/26/2020  Details of delivery can be found in separate delivery note.  Patient had a routine postpartum course. She received rhogam for Rh mismatch. Patient is discharged home 02/28/20. Delivery time: 7:16 PM    Magnesium Sulfate received: No BMZ received: No Rhophylac:Yes MMR:N/A Transfusion:No  Physical exam  Vitals:   02/27/20 0933 02/27/20 1403 02/27/20  2027 02/28/20 0536  BP: 114/72 117/70 118/74 109/79  Pulse: 73 67 72 70  Resp: '16 18 20 20  ' Temp: 98.3 F (36.8 C) 98.2 F (36.8 C) 98.1 F (36.7 C) 98.3 F (36.8 C)  TempSrc: Oral Oral Oral Oral  SpO2: 99% 99% 100% 100%  Weight:      Height:       General: alert, cooperative and no distress Lochia: appropriate Uterine Fundus: firm Incision: N/A DVT Evaluation: No evidence of DVT seen on physical exam. No significant calf/ankle edema. Labs: Lab Results  Component Value Date   WBC 12.3 (H) 02/26/2020   HGB 12.0 02/26/2020   HCT 35.5 (L) 02/26/2020   MCV 89.4 02/26/2020   PLT 354 02/26/2020   CMP Latest Ref Rng & Units 02/26/2020  Glucose 70 - 99 mg/dL 86  BUN 6 - 20 mg/dL 5(L)  Creatinine 0.44 - 1.00 mg/dL 0.52  Sodium 135 - 145 mmol/L 137  Potassium 3.5 - 5.1 mmol/L 3.0(L)  Chloride 98 - 111 mmol/L 104  CO2 22 - 32  mmol/L 21(L)  Calcium 8.9 - 10.3 mg/dL 9.7  Total Protein 6.5 - 8.1 g/dL 5.8(L)  Total Bilirubin 0.3 - 1.2 mg/dL 0.6  Alkaline Phos 38 - 126 U/L 89  AST 15 - 41 U/L 19  ALT 0 - 44 U/L 13   Edinburgh Score: Edinburgh Postnatal Depression Scale Screening Tool 02/27/2020  I have been able to laugh and see the funny side of things. 0  I have looked forward with enjoyment to things. 0  I have blamed myself unnecessarily when things went wrong. 1  I have been anxious or worried for no good reason. 1  I have felt scared or panicky for no good reason. 1  Things have been getting on top of me. 0  I have been so unhappy that I have had difficulty sleeping. 0  I have felt sad or miserable. 0  I have been so unhappy that I have been crying. 0  The thought of harming myself has occurred to me. 0  Edinburgh Postnatal Depression Scale Total 3    Discharge instruction: per After Visit Summary and "Baby and Me Booklet".  After visit meds:  Allergies as of 02/28/2020      Reactions   Codeine Nausea And Vomiting      Medication List    STOP taking these  medications   Aspirin Low Dose 81 MG EC tablet Generic drug: aspirin     TAKE these medications   acetaminophen 500 MG tablet Commonly known as: TYLENOL Take 1,000 mg by mouth every 6 (six) hours as needed for mild pain or headache.   FLUoxetine 20 MG tablet Commonly known as: PROZAC Take 20 mg by mouth daily.   ibuprofen 600 MG tablet Commonly known as: ADVIL Take 1 tablet (600 mg total) by mouth every 6 (six) hours.   omeprazole 20 MG capsule Commonly known as: PriLOSEC Take 1 capsule (20 mg total) by mouth 2 (two) times daily before a meal.   polyethylene glycol powder 17 GM/SCOOP powder Commonly known as: GLYCOLAX/MIRALAX Take 17 g by mouth daily.   prenatal multivitamin Tabs tablet Take 1 tablet by mouth daily at 12 noon.   promethazine 25 MG tablet Commonly known as: PHENERGAN TAKE 1 TABLET (25 MG TOTAL) BY MOUTH EVERY 6 (SIX) HOURS AS NEEDED FOR NAUSEA OR VOMITING.       Diet: routine diet  Activity: Advance as tolerated. Pelvic rest for 6 weeks.   Outpatient follow up:6 weeks Follow up Appt: Future Appointments  Date Time Provider [place]  04/10/2020  9:30 AM Aletha Halim, MD [facility]   Follow up Visit:   Please schedule this patient for Postpartum visit in: 6 weeks with the following provider: Any provider In-Person For C/S patients schedule nurse incision check in weeks 2 weeks: no Low risk pregnancy complicated by: n/a Delivery mode:  SVD Anticipated Birth Control:  OCP's, Vasectomy. Discussed importance of backup method until vasectomy is completed. PP Procedures needed: none  Schedule Integrated BH visit: no   Newborn Data: Live born female  Birth Weight:  3929 grams APGAR: 7, 9  Newborn Delivery   Birth date/time: 02/26/2020 19:16:00 Delivery type: Vaginal, Spontaneous      Baby Feeding: Breast Disposition:NICU   02/28/2020 Clarnce Flock, MD

## 2020-02-27 MED ORDER — RHO D IMMUNE GLOBULIN 1500 UNIT/2ML IJ SOSY
300.0000 ug | PREFILLED_SYRINGE | Freq: Once | INTRAMUSCULAR | Status: AC
  Administered 2020-02-27: 300 ug via INTRAVENOUS
  Filled 2020-02-27: qty 2

## 2020-02-27 NOTE — Progress Notes (Signed)
Report given to [place] RN as baby is transferring to NICU and mom will be moved to couplet care to room in.

## 2020-02-27 NOTE — Lactation Note (Signed)
This note was copied from a baby's chart. Lactation Consultation Note  Patient Name: Carol [name] [id number] Date: 02/27/2020   P3, Baby 69 hours old in NICU couplet care. 39 weeks.  Mother latched baby on other breast with intermittent swallows.  She had questions regarding her oversupply.  She states she had strong let down that eventually she could not longer bf and exclusively pumped.  Her children had reflux issues. Mother states she was pumping frequently because of work.  Discussed prepumping, laid back and upright football positions for later when milk comes in.  Reviewed various strategies for handling engorgement.  Also suggest waiting to pump to 3-4 weeks and not stocking up milk until 3-4 weeks because the frequent pumping can create an oversupply and allowing her baby to set up her milk supply.  This could change depending on infant's medical condition and if he is able to bf on demand with adequate transfer.  She admits that she was pumping frequently due to trying to "stock up".        Maternal Data    Feeding Feeding Type: Breast Fed  LATCH Score Latch: Grasps breast easily, tongue down, lips flanged, rhythmical sucking.  Audible Swallowing: A few with stimulation  Type of Nipple: Everted at rest and after stimulation  Comfort (Breast/Nipple): Soft / non-tender  Hold (Positioning): No assistance needed to correctly position infant at breast.  LATCH Score: 9  Interventions Interventions: Support pillows  Lactation Tools Discussed/Used     Consult Status      Carlye Grippe 02/27/2020, 10:15 AM

## 2020-02-27 NOTE — Progress Notes (Signed)
MOB was referred for history of depression/anxiety. * Referral screened out by Clinical Social Worker because none of the following criteria appear to apply: ~ History of anxiety/depression during this pregnancy, or of post-partum depression following prior delivery. No concerns of anxiety/depression noted in prenatal records.  ~ Diagnosis of anxiety and/or depression within last 3 years. Per chart review, MOB's anxiety/depression dates back to high school.  OR * MOB's symptoms currently being treated with medication and/or therapy. Please contact the Clinical Social Worker if needs arise, by [facility] request, or if MOB scores greater than 9/yes to question 10 on Edinburgh Postpartum Depression Screen.  Patient screened out for psychosocial assessment since none of the following apply:  Psychosocial stressors documented in mother or baby's chart  Gestation less than 32 weeks  Code at delivery   Infant with anomalies Please contact the Clinical Social Worker if specific needs arise, by MOB's request, or if MOB scores greater than 9/yes to question 10 on Edinburgh Postpartum Depression Screen.  Abundio Miu, [place] Worker [facility] Cell#: [phone]

## 2020-02-27 NOTE — Anesthesia Postprocedure Evaluation (Signed)
Anesthesia Post Note  Patient: Carol [name]  Procedure(s) Performed: AN AD [place]     Patient location during evaluation: Mother Baby Anesthesia Type: Epidural Level of consciousness: awake Pain management: satisfactory to patient Vital Signs Assessment: post-procedure vital signs reviewed and stable Respiratory status: spontaneous breathing Cardiovascular status: stable Anesthetic complications: no    Last Vitals:  Vitals:   02/27/20 0230 02/27/20 0626  BP: (!) 117/59 114/68  Pulse: 77 67  Resp: 20 18  Temp: 36.7 C 36.7 C  SpO2: 98% 100%    Last Pain:  Vitals:   02/27/20 0626  TempSrc: Oral  PainSc: 4    Pain Goal: Patients Stated Pain Goal: 2 (02/26/20 2243)              Epidural/Spinal Function Cutaneous sensation: Normal sensation (02/27/20 0626), Patient able to flex knees: Yes (02/27/20 0626), Patient able to lift hips off bed: Yes (02/27/20 0626), Back pain beyond tenderness at insertion site: No (02/27/20 0626), Progressively worsening motor and/or sensory loss: No (02/27/20 0626), Bowel and/or bladder incontinence post epidural: No (02/27/20 [id number])  Casimer Lanius

## 2020-02-27 NOTE — Progress Notes (Signed)
Notified Len Blalock CNM of baby's transfer to NICU for grunting. Mom and baby now in couplet care in room 326.

## 2020-02-27 NOTE — Progress Notes (Signed)
Post Partum Day #1 Subjective: no complaints, up ad lib and tolerating PO; infant being followed in couplet care room due to resp issues but is doing well- RN reports pt may be tx to a regular MB room later today; breastfeeding going well; contraception not discussed today but mention of vasectomy in chart; would like circ for infant when cleared by peds; infant with A pos blood type  Objective: BPs stable overnight> <120/50-60 Blood pressure 114/72, pulse 73, temperature 98.3 F (36.8 C), temperature source Oral, resp. rate 16, height 5\' 7"  (1.702 m), weight 95.9 kg, last menstrual period 06/11/2019, SpO2 99 %, unknown if currently breastfeeding.  Physical Exam:  General: alert, cooperative and no distress Lochia: appropriate Uterine Fundus: firm DVT Evaluation: No evidence of DVT seen on physical exam.  Recent Labs    02/26/20 0032  HGB 12.0  HCT 35.5*    Assessment/Plan: Plan for discharge tomorrow  Stable BPs Rhogam later today Circ when able   LOS: 1 day   Myrtis Ser CNM 02/27/2020, 10:17 AM

## 2020-02-28 LAB — RH IG WORKUP (INCLUDES ABO/RH)
ABO/RH(D): O NEG
Fetal Screen: NEGATIVE
Gestational Age(Wks): 39
Unit division: 0

## 2020-02-28 MED ORDER — POLYETHYLENE GLYCOL 3350 17 GM/SCOOP PO POWD: 17.0000 g | Freq: Every day | ORAL | 0 refills | Status: DC

## 2020-02-28 MED ORDER — IBUPROFEN 600 MG PO TABS: 600.0000 mg | ORAL_TABLET | Freq: Four times a day (QID) | ORAL | 0 refills | Status: DC

## 2020-02-28 NOTE — Discharge Instructions (Signed)

## 2020-02-28 NOTE — Lactation Note (Addendum)
This note was copied from a baby's chart. Lactation Consultation Note  Patient Name: Carol [name] [id number] Date: 02/28/2020 Reason for consult: Follow-up assessment    P3, Ex BF.  Baby 29 hours old in couplet care.  Baby latched in [place] hold upon entering with frequent swallows.  Mother states grunting during breastfeeding more infrequent. FOB had LC view video of infant bottle feeding last night with some gagging/grunting. Suggest using extra slow flow nipple when bottle feeding w/ paced feeding.  Reviewed engorgement care and monitoring voids/stools. Mother has strong letdown and history of oversupply. Reinforced prepumping which mother did prior to today's feeding for 2 min to lessen strong let down and suggest when post pumping for for comfort, mother pump for 5-7 min only.  Encouraged mother to not "stock up" until closer to returning to work. Reviewed engorgement care and monitoring voids/stools.      Maternal Data    Feeding Feeding Type: Breast Fed  LATCH Score Latch: Grasps breast easily, tongue down, lips flanged, rhythmical sucking.  Audible Swallowing: A few with stimulation  Type of Nipple: Everted at rest and after stimulation  Comfort (Breast/Nipple): Soft / non-tender  Hold (Positioning): No assistance needed to correctly position infant at breast.  LATCH Score: 9  Interventions Interventions: Breast feeding basics reviewed  Lactation Tools Discussed/Used     Consult Status Consult Status: Follow-up Date: 02/29/20 Follow-up type: In-patient    Vivianne Master [facility] 02/28/2020, 9:04 AM

## 2020-03-02 ENCOUNTER — Telehealth: Payer: Self-pay | Admitting: *Deleted

## 2020-03-02 MED ORDER — NIFEDIPINE ER OSMOTIC RELEASE 30 MG PO TB24: 30.0000 mg | ORAL_TABLET | Freq: Every day | ORAL | 2 refills | Status: DC

## 2020-03-02 NOTE — Telephone Encounter (Signed)
Called pt and informed her that Dr Harolyn Rutherford will start her on BP meds and to call us Monday for a BP check. Pt verbalizes and understands.

## 2020-03-02 NOTE — Telephone Encounter (Signed)
Pt called in to follow up one being sent to ED for her BP last night by after hours nurse line. Pt had lab work done and it was normal, pt was told by ED physician  to reach out to [place] for medication management. BP last night was 134/92 and this morning it was 148/91. Denies any HA, SOB, does have some swelling in her feet. Informed pt I will reach out to one of our providers and call her back.

## 2020-03-05 ENCOUNTER — Other Ambulatory Visit: Payer: Self-pay

## 2020-03-05 ENCOUNTER — Ambulatory Visit (INDEPENDENT_AMBULATORY_CARE_PROVIDER_SITE_OTHER): Payer: 59 | Admitting: *Deleted

## 2020-03-05 VITALS — BP 130/83 | HR 73

## 2020-03-05 DIAGNOSIS — O165 Unspecified maternal hypertension, complicating the puerperium: Secondary | ICD-10-CM

## 2020-03-05 NOTE — Progress Notes (Signed)
Subjective:  Carol [name] is a 34 y.o. female here for BP check.   Hypertension ROS: taking medications as instructed, no medication side effects noted, no TIA's, no chest pain on exertion, no dyspnea on exertion and no swelling of ankles.    Objective:  BP 130/83   Pulse 73   LMP 06/11/2019 (Exact Date)   Appearance alert, well appearing, and in no distress. General exam BP noted to be well controlled today in office.    Assessment:   Blood Pressure improved.   Plan:  Current treatment plan is effective, no change in therapy. Follow up as needed and at postpartum visit.

## 2020-03-06 ENCOUNTER — Telehealth: Payer: Self-pay | Admitting: *Deleted

## 2020-03-06 NOTE — Progress Notes (Signed)
Patient seen and assessed by nursing staff during this encounter. I have reviewed the chart and agree with the documentation and plan.  Verita Schneiders, MD 03/06/2020 2:57 PM

## 2020-03-06 NOTE — Telephone Encounter (Signed)
Spoke with pt regarding her questions about bleeding. Pt states she has only had one quarter sized clot and is not going through pads every 1-2 hours. Advised to continue to watch and if increase in clots or bleeding through a pad an hour to call off ice and or go to MAU. Pt verbalizes and understands.

## 2020-03-07 ENCOUNTER — Telehealth: Payer: Self-pay | Admitting: *Deleted

## 2020-03-07 ENCOUNTER — Other Ambulatory Visit: Payer: Self-pay

## 2020-03-07 ENCOUNTER — Encounter (HOSPITAL_COMMUNITY)
Admission: AD | Disposition: A | Discharge status: Home / Self Care | Payer: Self-pay | Source: Home / Self Care | Attending: Obstetrics & Gynecology

## 2020-03-07 ENCOUNTER — Inpatient Hospital Stay (HOSPITAL_COMMUNITY): Payer: 59 | Admitting: Anesthesiology

## 2020-03-07 ENCOUNTER — Encounter (HOSPITAL_COMMUNITY): Payer: Self-pay | Admitting: Obstetrics & Gynecology

## 2020-03-07 ENCOUNTER — Inpatient Hospital Stay (HOSPITAL_COMMUNITY): Payer: 59

## 2020-03-07 ENCOUNTER — Observation Stay (HOSPITAL_COMMUNITY)
Admission: AD | Admit: 2020-03-07 | Discharge: 2020-03-08 | Disposition: A | Discharge status: Home / Self Care | Payer: 59 | Attending: Obstetrics & Gynecology | Admitting: Obstetrics & Gynecology

## 2020-03-07 DIAGNOSIS — Z9889 Other specified postprocedural states: Secondary | ICD-10-CM

## 2020-03-07 DIAGNOSIS — O26899 Other specified pregnancy related conditions, unspecified trimester: Secondary | ICD-10-CM

## 2020-03-07 DIAGNOSIS — D473 Essential (hemorrhagic) thrombocythemia: Secondary | ICD-10-CM

## 2020-03-07 DIAGNOSIS — Z20822 Contact with and (suspected) exposure to covid-19: Secondary | ICD-10-CM | POA: Insufficient documentation

## 2020-03-07 DIAGNOSIS — D75839 Thrombocytosis, unspecified: Secondary | ICD-10-CM

## 2020-03-07 HISTORY — DX: Other specified postprocedural states: Z98.890

## 2020-03-07 HISTORY — PX: DILATION AND EVACUATION: SHX1459

## 2020-03-07 LAB — CBC
HCT: 39.5 % (ref 36.0–46.0)
HCT: 29.6 % — ABNORMAL LOW (ref 36.0–46.0)
HCT: 26 % — ABNORMAL LOW (ref 36.0–46.0)
Hemoglobin: 13.1 g/dL (ref 12.0–15.0)
Hemoglobin: 10 g/dL — ABNORMAL LOW (ref 12.0–15.0)
Hemoglobin: 8.8 g/dL — ABNORMAL LOW (ref 12.0–15.0)
MCH: 29.4 pg (ref 26.0–34.0)
MCH: 30.3 pg (ref 26.0–34.0)
MCH: 30.3 pg (ref 26.0–34.0)
MCHC: 33.2 g/dL (ref 30.0–36.0)
MCHC: 33.8 g/dL (ref 30.0–36.0)
MCHC: 33.8 g/dL (ref 30.0–36.0)
MCV: 88.6 fL (ref 80.0–100.0)
MCV: 89.7 fL (ref 80.0–100.0)
MCV: 89.7 fL (ref 80.0–100.0)
Platelets: 540 10*3/uL — ABNORMAL HIGH (ref 150–400)
Platelets: 448 10*3/uL — ABNORMAL HIGH (ref 150–400)
Platelets: 473 10*3/uL — ABNORMAL HIGH (ref 150–400)
RBC: 4.46 MIL/uL (ref 3.87–5.11)
RBC: 3.3 MIL/uL — ABNORMAL LOW (ref 3.87–5.11)
RBC: 2.9 MIL/uL — ABNORMAL LOW (ref 3.87–5.11)
RDW: 12.8 % (ref 11.5–15.5)
RDW: 12.8 % (ref 11.5–15.5)
RDW: 12.9 % (ref 11.5–15.5)
WBC: 10.8 10*3/uL — ABNORMAL HIGH (ref 4.0–10.5)
WBC: 16.7 10*3/uL — ABNORMAL HIGH (ref 4.0–10.5)
WBC: 12.1 10*3/uL — ABNORMAL HIGH (ref 4.0–10.5)
nRBC: 0 % (ref 0.0–0.2)
nRBC: 0 % (ref 0.0–0.2)
nRBC: 0 % (ref 0.0–0.2)

## 2020-03-07 LAB — WET PREP, GENITAL
Clue Cells Wet Prep HPF POC: NONE SEEN
Sperm: NONE SEEN
Trich, Wet Prep: NONE SEEN
Yeast Wet Prep HPF POC: NONE SEEN

## 2020-03-07 LAB — RESPIRATORY PANEL BY RT PCR (FLU A&B, COVID)
Influenza A by PCR: NEGATIVE
Influenza B by PCR: NEGATIVE
SARS Coronavirus 2 by RT PCR: NEGATIVE

## 2020-03-07 LAB — PREPARE RBC (CROSSMATCH)

## 2020-03-07 SURGERY — DILATION AND EVACUATION, UTERUS
Anesthesia: General | Site: Vagina

## 2020-03-07 MED ORDER — SIMETHICONE 80 MG PO CHEW: 80.0000 mg | CHEWABLE_TABLET | Freq: Four times a day (QID) | ORAL | Status: DC

## 2020-03-07 MED ORDER — ONDANSETRON HCL 4 MG PO TABS: 4.0000 mg | ORAL_TABLET | Freq: Four times a day (QID) | ORAL | Status: DC | PRN

## 2020-03-07 MED ORDER — METHYLERGONOVINE MALEATE 0.2 MG/ML IJ SOLN
0.2000 mg | Freq: Once | INTRAMUSCULAR | Status: DC
  Filled 2020-03-07: qty 1

## 2020-03-07 MED ORDER — FENTANYL CITRATE (PF) 250 MCG/5ML IJ SOLN
INTRAMUSCULAR | Status: AC
  Filled 2020-03-07: qty 5

## 2020-03-07 MED ORDER — LIDOCAINE 2% (20 MG/ML) 5 ML SYRINGE
INTRAMUSCULAR | Status: DC | PRN
  Administered 2020-03-07: 60 mg via INTRAVENOUS

## 2020-03-07 MED ORDER — SIMETHICONE 80 MG PO CHEW: 80.0000 mg | CHEWABLE_TABLET | Freq: Four times a day (QID) | ORAL | Status: DC | PRN

## 2020-03-07 MED ORDER — SOD CITRATE-CITRIC ACID 500-334 MG/5ML PO SOLN
30.0000 mL | ORAL | Status: AC
  Administered 2020-03-07: 30 mL via ORAL
  Filled 2020-03-07: qty 30

## 2020-03-07 MED ORDER — PANTOPRAZOLE SODIUM 40 MG PO TBEC: 40.0000 mg | DELAYED_RELEASE_TABLET | Freq: Every day | ORAL | Status: DC

## 2020-03-07 MED ORDER — LACTATED RINGERS IV SOLN: INTRAVENOUS | Status: DC

## 2020-03-07 MED ORDER — ONDANSETRON HCL 4 MG/2ML IJ SOLN
INTRAMUSCULAR | Status: DC | PRN
  Administered 2020-03-07: 4 mg via INTRAVENOUS

## 2020-03-07 MED ORDER — SUCCINYLCHOLINE CHLORIDE 200 MG/10ML IV SOSY
PREFILLED_SYRINGE | INTRAVENOUS | Status: AC
  Filled 2020-03-07: qty 10

## 2020-03-07 MED ORDER — PROMETHAZINE HCL 25 MG/ML IJ SOLN: 6.2500 mg | INTRAMUSCULAR | Status: DC | PRN

## 2020-03-07 MED ORDER — MENTHOL 3 MG MT LOZG: 1.0000 | LOZENGE | OROMUCOSAL | Status: DC | PRN

## 2020-03-07 MED ORDER — SCOPOLAMINE 1 MG/3DAYS TD PT72
1.0000 | MEDICATED_PATCH | TRANSDERMAL | Status: DC
  Administered 2020-03-07: 1.5 mg via TRANSDERMAL

## 2020-03-07 MED ORDER — ALBUMIN HUMAN 5 % IV SOLN: INTRAVENOUS | Status: DC | PRN

## 2020-03-07 MED ORDER — MORPHINE SULFATE (PF) 2 MG/ML IV SOLN: 1.0000 mg | INTRAVENOUS | Status: DC | PRN

## 2020-03-07 MED ORDER — OXYTOCIN 10 UNIT/ML IJ SOLN
INTRAMUSCULAR | Status: DC | PRN
  Administered 2020-03-07: 30 [IU] via INTRAMUSCULAR

## 2020-03-07 MED ORDER — OXYTOCIN 10 UNIT/ML IJ SOLN
INTRAMUSCULAR | Status: AC
  Filled 2020-03-07: qty 3

## 2020-03-07 MED ORDER — OXYTOCIN 40 UNITS IN NORMAL SALINE INFUSION - SIMPLE MED: INTRAVENOUS | Status: DC | PRN

## 2020-03-07 MED ORDER — ACETAMINOPHEN 500 MG PO TABS
1000.0000 mg | ORAL_TABLET | ORAL | Status: AC
  Administered 2020-03-07: 1000 mg via ORAL
  Filled 2020-03-07: qty 2

## 2020-03-07 MED ORDER — FENTANYL CITRATE (PF) 100 MCG/2ML IJ SOLN
25.0000 ug | INTRAMUSCULAR | Status: DC | PRN
  Administered 2020-03-07 (×2): 50 ug via INTRAVENOUS

## 2020-03-07 MED ORDER — OXYCODONE-ACETAMINOPHEN 5-325 MG PO TABS: 1.0000 | ORAL_TABLET | ORAL | Status: DC | PRN

## 2020-03-07 MED ORDER — SCOPOLAMINE 1 MG/3DAYS TD PT72
MEDICATED_PATCH | TRANSDERMAL | Status: AC
  Filled 2020-03-07: qty 1

## 2020-03-07 MED ORDER — MIDAZOLAM HCL 2 MG/2ML IJ SOLN
INTRAMUSCULAR | Status: DC | PRN
  Administered 2020-03-07: 2 mg via INTRAVENOUS

## 2020-03-07 MED ORDER — ZOLPIDEM TARTRATE 5 MG PO TABS: 5.0000 mg | ORAL_TABLET | Freq: Every evening | ORAL | Status: DC | PRN

## 2020-03-07 MED ORDER — BISACODYL 10 MG RE SUPP: 10.0000 mg | Freq: Every day | RECTAL | Status: DC | PRN

## 2020-03-07 MED ORDER — LIDOCAINE 2% (20 MG/ML) 5 ML SYRINGE
INTRAMUSCULAR | Status: AC
  Filled 2020-03-07: qty 5

## 2020-03-07 MED ORDER — TRANEXAMIC ACID-NACL 1000-0.7 MG/100ML-% IV SOLN
INTRAVENOUS | Status: DC | PRN
  Administered 2020-03-07: 1000 mg via INTRAVENOUS

## 2020-03-07 MED ORDER — SUCCINYLCHOLINE CHLORIDE 20 MG/ML IJ SOLN
INTRAMUSCULAR | Status: DC | PRN
  Administered 2020-03-07: 100 mg via INTRAVENOUS

## 2020-03-07 MED ORDER — SODIUM CHLORIDE 0.9 % IV SOLN: INTRAVENOUS | Status: DC | PRN

## 2020-03-07 MED ORDER — SENNOSIDES-DOCUSATE SODIUM 8.6-50 MG PO TABS: 1.0000 | ORAL_TABLET | Freq: Every evening | ORAL | Status: DC | PRN

## 2020-03-07 MED ORDER — TEMAZEPAM 15 MG PO CAPS: 15.0000 mg | ORAL_CAPSULE | Freq: Every evening | ORAL | Status: DC | PRN

## 2020-03-07 MED ORDER — DOCUSATE SODIUM 100 MG PO CAPS: 100.0000 mg | ORAL_CAPSULE | Freq: Two times a day (BID) | ORAL | Status: DC

## 2020-03-07 MED ORDER — FAMOTIDINE IN NACL 20-0.9 MG/50ML-% IV SOLN
20.0000 mg | INTRAVENOUS | Status: AC
  Administered 2020-03-07: 20 mg via INTRAVENOUS
  Filled 2020-03-07: qty 50

## 2020-03-07 MED ORDER — EPHEDRINE 5 MG/ML INJ
INTRAVENOUS | Status: AC
  Filled 2020-03-07: qty 10

## 2020-03-07 MED ORDER — LACTATED RINGERS IV BOLUS
125.0000 mL | Freq: Once | INTRAVENOUS | Status: AC
  Administered 2020-03-07: 125 mL via INTRAVENOUS

## 2020-03-07 MED ORDER — ACETAMINOPHEN 500 MG PO TABS
1000.0000 mg | ORAL_TABLET | Freq: Four times a day (QID) | ORAL | Status: DC
  Administered 2020-03-07: 1000 mg via ORAL
  Filled 2020-03-07: qty 2

## 2020-03-07 MED ORDER — PHENYLEPHRINE 40 MCG/ML (10ML) SYRINGE FOR IV PUSH (FOR BLOOD PRESSURE SUPPORT)
PREFILLED_SYRINGE | INTRAVENOUS | Status: DC | PRN
  Administered 2020-03-07 (×2): 120 ug via INTRAVENOUS
  Administered 2020-03-07: 160 ug via INTRAVENOUS

## 2020-03-07 MED ORDER — FENTANYL CITRATE (PF) 100 MCG/2ML IJ SOLN
INTRAMUSCULAR | Status: AC
  Filled 2020-03-07: qty 2

## 2020-03-07 MED ORDER — MISOPROSTOL 200 MCG PO TABS
ORAL_TABLET | ORAL | Status: AC
  Filled 2020-03-07: qty 5

## 2020-03-07 MED ORDER — ROCURONIUM BROMIDE 10 MG/ML (PF) SYRINGE
PREFILLED_SYRINGE | INTRAVENOUS | Status: AC
  Filled 2020-03-07: qty 10

## 2020-03-07 MED ORDER — FLEET ENEMA 7-19 GM/118ML RE ENEM: 1.0000 | ENEMA | Freq: Once | RECTAL | Status: DC | PRN

## 2020-03-07 MED ORDER — TRANEXAMIC ACID-NACL 1000-0.7 MG/100ML-% IV SOLN
INTRAVENOUS | Status: AC
  Filled 2020-03-07: qty 100

## 2020-03-07 MED ORDER — IBUPROFEN 800 MG PO TABS
800.0000 mg | ORAL_TABLET | Freq: Three times a day (TID) | ORAL | Status: DC
  Administered 2020-03-07 – 2020-03-08 (×3): 800 mg via ORAL
  Filled 2020-03-07 (×3): qty 1

## 2020-03-07 MED ORDER — FAMOTIDINE IN NACL 20-0.9 MG/50ML-% IV SOLN
INTRAVENOUS | Status: AC
  Filled 2020-03-07: qty 50

## 2020-03-07 MED ORDER — FENTANYL CITRATE (PF) 100 MCG/2ML IJ SOLN
INTRAMUSCULAR | Status: DC | PRN
  Administered 2020-03-07: 100 ug via INTRAVENOUS

## 2020-03-07 MED ORDER — SODIUM CHLORIDE 0.9 % IV SOLN: INTRAVENOUS | Status: DC

## 2020-03-07 MED ORDER — SODIUM CHLORIDE 0.9% IV SOLUTION: Freq: Once | INTRAVENOUS | Status: DC

## 2020-03-07 MED ORDER — ONDANSETRON HCL 4 MG/2ML IJ SOLN: 4.0000 mg | Freq: Four times a day (QID) | INTRAMUSCULAR | Status: DC | PRN

## 2020-03-07 MED ORDER — ONDANSETRON HCL 4 MG/2ML IJ SOLN
INTRAMUSCULAR | Status: AC
  Filled 2020-03-07: qty 2

## 2020-03-07 MED ORDER — METHYLERGONOVINE MALEATE 0.2 MG/ML IJ SOLN
INTRAMUSCULAR | Status: DC | PRN
  Administered 2020-03-07: .2 mg via INTRAMUSCULAR

## 2020-03-07 MED ORDER — PROPOFOL 10 MG/ML IV BOLUS
INTRAVENOUS | Status: AC
  Filled 2020-03-07: qty 20

## 2020-03-07 MED ORDER — DEXAMETHASONE SODIUM PHOSPHATE 10 MG/ML IJ SOLN
INTRAMUSCULAR | Status: DC | PRN
  Administered 2020-03-07: 10 mg via INTRAVENOUS

## 2020-03-07 MED ORDER — CEFAZOLIN SODIUM-DEXTROSE 2-4 GM/100ML-% IV SOLN
2.0000 g | INTRAVENOUS | Status: AC
  Administered 2020-03-07: 2 g via INTRAVENOUS
  Filled 2020-03-07: qty 100

## 2020-03-07 MED ORDER — PRENATAL MULTIVITAMIN CH
1.0000 | ORAL_TABLET | Freq: Every day | ORAL | Status: DC
  Administered 2020-03-08: 13:00:00 1 via ORAL
  Filled 2020-03-07: qty 1

## 2020-03-07 MED ORDER — MISOPROSTOL 100 MCG PO TABS
ORAL_TABLET | ORAL | Status: DC | PRN
  Administered 2020-03-07: 1000 ug via RECTAL

## 2020-03-07 MED ORDER — DEXAMETHASONE SODIUM PHOSPHATE 10 MG/ML IJ SOLN
INTRAMUSCULAR | Status: AC
  Filled 2020-03-07: qty 1

## 2020-03-07 MED ORDER — PROPOFOL 10 MG/ML IV BOLUS
INTRAVENOUS | Status: DC | PRN
  Administered 2020-03-07: 200 mg via INTRAVENOUS
  Administered 2020-03-07: 50 mg via INTRAVENOUS

## 2020-03-07 MED ORDER — MIDAZOLAM HCL 2 MG/2ML IJ SOLN
INTRAMUSCULAR | Status: AC
  Filled 2020-03-07: qty 2

## 2020-03-07 MED ORDER — EPHEDRINE SULFATE-NACL 50-0.9 MG/10ML-% IV SOSY
PREFILLED_SYRINGE | INTRAVENOUS | Status: DC | PRN
  Administered 2020-03-07: 10 mg via INTRAVENOUS
  Administered 2020-03-07: 15 mg via INTRAVENOUS
  Administered 2020-03-07: 10 mg via INTRAVENOUS

## 2020-03-07 MED ORDER — PANTOPRAZOLE SODIUM 40 MG PO TBEC
40.0000 mg | DELAYED_RELEASE_TABLET | Freq: Every day | ORAL | Status: DC
  Administered 2020-03-07 – 2020-03-08 (×2): 40 mg via ORAL
  Filled 2020-03-07 (×2): qty 1

## 2020-03-07 MED ORDER — OXYTOCIN 10 UNIT/ML IJ SOLN: INTRAMUSCULAR | Status: DC | PRN

## 2020-03-07 SURGICAL SUPPLY — 26 items
ADAPTER VACURETTE TBG SET 14 (CANNULA) IMPLANT
CATH ROBINSON RED A/P 16FR (CATHETERS) ×2 IMPLANT
DECANTER SPIKE VIAL GLASS SM (MISCELLANEOUS) ×2 IMPLANT
FILTER UTR ASPR ASSEMBLY (MISCELLANEOUS) ×2 IMPLANT
GLOVE BIOGEL PI IND STRL 7.0 (GLOVE) ×1 IMPLANT
GLOVE BIOGEL PI IND STRL 7.5 (GLOVE) ×1 IMPLANT
GLOVE BIOGEL PI INDICATOR 7.0 (GLOVE) ×1
GLOVE BIOGEL PI INDICATOR 7.5 (GLOVE) ×1
GOWN STRL REUS W/ TWL LRG LVL3 (GOWN DISPOSABLE) ×2 IMPLANT
GOWN STRL REUS W/TWL LRG LVL3 (GOWN DISPOSABLE) ×2
HOSE CONNECTING 18IN BERKELEY (TUBING) ×2 IMPLANT
KIT BERKELEY 1ST TRI 3/8 NO TR (MISCELLANEOUS) ×2 IMPLANT
KIT BERKELEY 1ST TRIMESTER 3/8 (MISCELLANEOUS) ×2 IMPLANT
KIT BERKELEY 2ND TRIMESTER 1/2 (COLLECTOR) IMPLANT
NS IRRIG 1000ML POUR BTL (IV SOLUTION) ×2 IMPLANT
PACK VAGINAL MINOR WOMEN LF (CUSTOM PROCEDURE TRAY) ×2 IMPLANT
PAD OB MATERNITY 4.3X12.25 (PERSONAL CARE ITEMS) ×2 IMPLANT
TOWEL GREEN STERILE FF (TOWEL DISPOSABLE) ×4 IMPLANT
TUBE VACURETTE 2ND TRIMESTER (CANNULA) IMPLANT
UNDERPAD 30X36 HEAVY ABSORB (UNDERPADS AND DIAPERS) ×2 IMPLANT
VACURETTE 10 RIGID CVD (CANNULA) ×2 IMPLANT
VACURETTE 12 RIGID CVD (CANNULA) IMPLANT
VACURETTE 14MM CVD 1/2 BASE (CANNULA) IMPLANT
VACURETTE 16MM ASPIR CVD .5 (CANNULA) IMPLANT
VACURETTE 8 RIGID CVD (CANNULA) IMPLANT
VACURETTE 9 RIGID CVD (CANNULA) IMPLANT

## 2020-03-07 NOTE — Telephone Encounter (Signed)
Pt called after hours nurse line and was told to call us first thing this AM. Pt states her bleeding is getting worse and does notice more clots coming out when she uses the bathroom. Spoke with Provider and recommend pt go to MAU to be evaluated. Pt verbalizes and understands.

## 2020-03-07 NOTE — MAU Note (Signed)
Carol [name] is a 34 y.o. here in MAU reporting: states after she left this hospital bleeding was minimal, sometimes at night it would be heavier. Last night started seeing some clots, mostly in the toilet. Had a gush of bleeding and it soaked through to her sheets. Still feeling the increased bleeding. Is wearing a pad. Seeing some clots. Having some cramping. SVD 03/21. Is breast feeding.  Onset of complaint: last night  Pain score: 2/10  Vitals:   03/07/20 0945  BP: 119/71  Pulse: 78  Resp: 16  Temp: 98.3 F (36.8 C)  SpO2: 99%     Lab orders placed from triage: none

## 2020-03-07 NOTE — Transfer of Care (Signed)
Immediate Anesthesia Transfer of Care Note  Patient: Carol [name]  Procedure(s) Performed: DILATATION AND EVACUATIONf with ULTRASOUND GUIDANCE (N/A Vagina )  Patient Location: PACU  Anesthesia Type:General  Level of Consciousness: drowsy and patient cooperative  Airway & Oxygen Therapy: Patient Spontanous Breathing  Post-op Assessment: Report given to RN  Post vital signs: Reviewed and stable  Last Vitals:  Vitals Value Taken Time  BP 108/84 03/07/20 1550  Temp 37.2 C 03/07/20 1550  Pulse 92 03/07/20 1555  Resp 14 03/07/20 1555  SpO2 99 % 03/07/20 1555  Vitals shown include unvalidated device data.  Last Pain:  Vitals:   03/07/20 0945  TempSrc: Oral  PainSc:       Patients Stated Pain Goal: 3 ([id number])  Complications: No apparent anesthesia complications

## 2020-03-07 NOTE — Progress Notes (Signed)
OB Post Op Check Note  03/07/2020 - 9:04 PM  Brief Clinical History: 34 y.o. POD#0 s/p u/s guided suction d&c (EBL 4000 mL) for retained POCs after 3/21 vaginal delivery.   PMHx significant for anxiety.  Subjective: patient feeling hungry. Went to the bathroom to void and felt a little lightheaded but voided a good amount. Normal VB per RN  Objective:   Current Vital Signs 24h Vital Sign Ranges  T 98.4 F (36.9 C) Temp  Avg: 98.6 F (37 C)  Min: 98.3 F (36.8 C)  Max: 99 F (37.2 C)  BP 103/64 BP  Min: 101/98  Max: 119/71  HR 64 Pulse  Avg: 72.6  Min: 62  Max: 99  RR 18 Resp  Avg: 16.2  Min: 14  Max: 19  SaO2 100 % Room Air SpO2  Avg: 98.9 %  Min: 97 %  Max: 100 %       24 Hour I/O Current Shift I/O  Time Ins Outs No intake/output data recorded. 03/31 1901 - 04/01 0700 In: 480 [P.O.:480] Out: -     General: NAD Abdomen: soft, nttp, fundus not palpated GU: no gross VB CV: S1, S2 normal, no murmur, rub or gallop, regular rate and rhythm Pulm: CTAB Extremities: no clubbing, cyanosis or edema; SCDs in place Neuro: A&O x 3  Medications: Current Facility-Administered Medications  Medication Dose Route Frequency Provider Last Rate Last Admin  . 0.9 %  sodium chloride infusion (Manually program via Guardrails IV Fluids)   Intravenous Once Aletha Halim, MD      . famotidine (PEPCID) 20-0.9 MG/50ML-% IVPB           . fentaNYL (SUBLIMAZE) 100 MCG/2ML injection           . ibuprofen (ADVIL) tablet 800 mg  800 mg Oral Q8H Aletha Halim, MD   800 mg at 03/07/20 2009  . lactated ringers infusion   Intravenous Continuous Aletha Halim, MD 125 mL/hr at 03/07/20 1824 New Bag at 03/07/20 1824  . menthol-cetylpyridinium (CEPACOL) lozenge 3 mg  1 lozenge Oral Q2H PRN Aletha Halim, MD      . methylergonovine (METHERGINE) injection 0.2 mg  0.2 mg Intramuscular Once Aletha Halim, MD      . morphine 2 MG/ML injection 1-2 mg  1-2 mg Intravenous Q3H PRN Aletha Halim, MD       . ondansetron (ZOFRAN) tablet 4 mg  4 mg Oral Q6H PRN Aletha Halim, MD       Or  . ondansetron (ZOFRAN) injection 4 mg  4 mg Intravenous Q6H PRN Aletha Halim, MD      . oxyCODONE-acetaminophen (PERCOCET/ROXICET) 5-325 MG per tablet 1-2 tablet  1-2 tablet Oral Q4H PRN Aletha Halim, MD      . pantoprazole (PROTONIX) EC tablet 40 mg  40 mg Oral Daily Juanna Cao T, MD   40 mg at 03/07/20 2011  . [START ON 03/08/2020] prenatal multivitamin tablet 1 tablet  1 tablet Oral Q1200 Juanna Cao T, MD      . scopolamine (TRANSDERM-SCOP) 1 MG/3DAYS 1.5 mg  1 patch Transdermal Q72H Aletha Halim, MD   1.5 mg at 03/07/20 1421  . scopolamine (TRANSDERM-SCOP) 1 MG/3DAYS           . senna-docusate (Senokot-S) tablet 1 tablet  1 tablet Oral QHS PRN Aletha Halim, MD      . simethicone (MYLICON) chewable tablet 80 mg  80 mg Oral QID PRN Aletha Halim, MD  Laboratory: Recent Labs  Lab 03/07/20 1046 03/07/20 1716  WBC 10.8* 16.7*  HGB 13.1 10.0*  HCT 39.5 29.6*  PLT 540* 448*   Radiology: No new imaging  A/P: pt doing well *Routine post op care. Follow up 2200 and 0600 CBC. ADAT Breast feeding. Likely d/c tomorrow  Aletha Halim, Brooke Bonito MD Attending Center for [facility])

## 2020-03-07 NOTE — MAU Note (Signed)
covid swab collected, will walk to lab

## 2020-03-07 NOTE — MAU Provider Note (Signed)
History     CSN: [id number]  Arrival date and time: 03/07/20 [id number]   First Provider Initiated Contact with Patient 03/07/20 1021      Chief Complaint  Patient presents with  . Vaginal Bleeding  . Abdominal Pain   Ms. Carol [name] is a 34 y.o. [phone] at [redacted]w[redacted]d who presents to MAU for excessive postpartum bleeding. Pt reports NSVD on 02/26/2020 and was discharged from the hospital on 02/28/2020 with minimal bleeding. Pt reports she noticed an increased amount of bleeding on Sunday [name] 28. Pt reports she experienced small clots with "strings" coming off the edges. Patient reports at first the bleeding was only heavier at night and still light during the day, but yesterday felt like the bleeding was heavier during the day as well. Patient reports that she experienced a large gush of blood this morning that soaked the sheets around 630AM, but she has not experienced any bleeding since being in MAU. Patient reports also experiencing minor cramping.  Pt denies N/V, abdominal pain, constipation, diarrhea, or urinary problems. Pt denies fever, chills, fatigue, sweating or changes in appetite. Pt denies SOB or chest pain. Pt denies dizziness, HA, light-headedness, weakness.  Problems this pregnancy include: high blood pressure after discharge from hospital Allergies? Codeine Current medications/supplements? Procardia, Porzac, PNV Pregnant/postpartum/breastfeeding? breastfeeding Prenatal care provider? [facility], next appt 04/10/2020   OB History    Gravida  4   Carol  3   Term  3   Preterm      AB  1   Living  3     SAB  1   TAB      Ectopic      Multiple  0   Live Births  3           Past Medical History:  Diagnosis Date  . Anxiety   . Depression    doing ok  . Migraines   . Vaginal Pap smear, abnormal    cryo therapy, ok since    Past Surgical History:  Procedure Laterality Date  . CRYOTHERAPY  2012    Family History  Problem Relation  Age of Onset  . Lung disease Mother   . Asthma Mother   . Cancer Mother        skin  . Gout Father   . Heart disease Father        a-fib  . Cancer Maternal Grandmother        breast, pancreatic  . Cancer Maternal Grandfather        colon  . Heart disease Paternal Grandfather     Social History   Tobacco Use  . Smoking status: Former Research scientist (life sciences)  . Smokeless tobacco: Never Used  . Tobacco comment: prior to first preg  Substance Use Topics  . Alcohol use: No  . Drug use: No    Allergies:  Allergies  Allergen Reactions  . Codeine Nausea And Vomiting    Medications Prior to Admission  Medication Sig Dispense Refill Last Dose  . acetaminophen (TYLENOL) 500 MG tablet Take 1,000 mg by mouth every 6 (six) hours as needed for mild pain or headache.    Past Week at Unknown time  . FLUoxetine (PROZAC) 20 MG tablet Take 20 mg by mouth daily.   03/06/2020 at Unknown time  . ibuprofen (ADVIL) 600 MG tablet Take 1 tablet (600 mg total) by mouth every 6 (six) hours. 30 tablet 0 03/06/2020 at Unknown time  . NIFEdipine (PROCARDIA-XL/NIFEDICAL-XL) 30 MG 24  hr tablet Take 1 tablet (30 mg total) by mouth daily. 30 tablet 2 03/07/2020 at Unknown time  . Prenatal Vit-Fe Fumarate-FA (PRENATAL MULTIVITAMIN) TABS tablet Take 1 tablet by mouth daily at 12 noon.   03/06/2020 at Unknown time  . omeprazole (PRILOSEC) 20 MG capsule Take 1 capsule (20 mg total) by mouth 2 (two) times daily before a meal. 60 capsule 3   . polyethylene glycol powder (GLYCOLAX/MIRALAX) 17 GM/SCOOP powder Take 17 g by mouth daily. 510 g 0   . promethazine (PHENERGAN) 25 MG tablet TAKE 1 TABLET (25 MG TOTAL) BY MOUTH EVERY 6 (SIX) HOURS AS NEEDED FOR NAUSEA OR VOMITING. 30 tablet 1  at not taking    Review of Systems  Constitutional: Negative for chills, diaphoresis, fatigue and fever.  Eyes: Negative for visual disturbance.  Respiratory: Negative for shortness of breath.   Cardiovascular: Negative for chest pain.   Gastrointestinal: Negative for abdominal pain, constipation, diarrhea, nausea and vomiting.  Genitourinary: Positive for vaginal bleeding. Negative for dysuria, flank pain, frequency, pelvic pain, urgency and vaginal discharge.  Neurological: Negative for dizziness, weakness, light-headedness and headaches.   Physical Exam   Blood pressure 107/70, pulse 62, temperature 98.3 F (36.8 C), temperature source Oral, resp. rate 16, height 5\' 7"  (1.702 m), weight 84.3 kg, last menstrual period 06/11/2019, SpO2 99 %, unknown if currently breastfeeding.  Patient Vitals for the past 24 hrs:  BP Temp Temp src Pulse Resp SpO2 Height Weight  03/07/20 1014 107/70 -- -- 62 -- -- -- --  03/07/20 0945 119/71 98.3 F (36.8 C) Oral 78 16 99 % -- --  03/07/20 0940 -- -- -- -- -- -- 5\' 7"  (1.702 m) 84.3 kg   Physical Exam  Constitutional: She is oriented to person, place, and time. She appears well-developed and well-nourished. No distress.  HENT:  Head: Normocephalic and atraumatic.  Respiratory: Effort normal.  GI: Soft. She exhibits no distension and no mass. There is no abdominal tenderness. There is no rebound and no guarding.  Genitourinary: There is no rash, tenderness or lesion on the right labia. There is no rash, tenderness or lesion on the left labia. Uterus is enlarged. Uterus is not tender. Cervix exhibits no motion tenderness, no discharge and no friability. Right adnexum displays no mass, no tenderness and no fullness. Left adnexum displays no mass, no tenderness and no fullness.    Vaginal bleeding (minimal, thick red blood present on exam, able to be easily cleared with single Fox swab, minute bright red bleeding actively coming from external os.) present.     No vaginal discharge or tenderness.  There is bleeding (minimal, thick red blood present on exam, able to be easily cleared with single Fox swab, minute bright red bleeding actively coming from external os.) in the vagina. No tenderness  in the vagina.  Neurological: She is alert and oriented to person, place, and time.  Skin: Skin is warm and dry. She is not diaphoretic.  Psychiatric: She has a normal mood and affect. Her behavior is normal. Judgment and thought content normal.   Results for orders placed or performed during the hospital encounter of 03/07/20 (from the past 24 hour(s))  CBC     Status: Abnormal   Collection Time: 03/07/20 10:46 AM  Result Value Ref Range   WBC 10.8 (H) 4.0 - 10.5 K/uL   RBC 4.46 3.87 - 5.11 MIL/uL   Hemoglobin 13.1 12.0 - 15.0 g/dL   HCT 39.5 36.0 - 46.0 %  MCV 88.6 80.0 - 100.0 fL   MCH 29.4 26.0 - 34.0 pg   MCHC 33.2 30.0 - 36.0 g/dL   RDW 12.8 11.5 - 15.5 %   Platelets 540 (H) 150 - 400 K/uL   nRBC 0.0 0.0 - 0.2 %  Wet prep, genital     Status: Abnormal   Collection Time: 03/07/20 10:59 AM   Specimen: Cervix  Result Value Ref Range   Yeast Wet Prep HPF POC NONE SEEN NONE SEEN   Trich, Wet Prep NONE SEEN NONE SEEN   Clue Cells Wet Prep HPF POC NONE SEEN NONE SEEN   WBC, Wet Prep HPF POC MANY (A) NONE SEEN   Sperm NONE SEEN     US PELVIS (TRANSABDOMINAL ONLY)  Result Date: 03/07/2020 CLINICAL DATA:  Excessive postpartum bleeding, post vaginal delivery 02/26/2020 EXAM: TRANSABDOMINAL ULTRASOUND OF PELVIS TECHNIQUE: Transabdominal ultrasound examination of the pelvis was performed including evaluation of the uterus, ovaries, adnexal regions, and pelvic cul-de-sac. Transvaginal imaging was not performed due to recent postpartum status COMPARISON:  None FINDINGS: Uterus Measurements: 13.1 x 8.7 x 11.4 cm = volume: 683 mL. Enlarged uterus consistent with postpartum state. No focal myometrial mass Endometrium Thickness: 45 mm. Complex heterogeneous material identified within distended endometrial canal, including fluid, blood, and abnormal echogenic material, a small portion of which demonstrates internal blood flow on color Doppler imaging. Findings are highly suspicious for retained  products of conception. Right ovary Measurements: 3.3 x 1.5 x 1.8 cm = volume: 4.6 mL. Normal morphology without mass Left ovary Measurements: 3.5 x 1.7 x 2.3 cm = volume: 7.2 mL. Normal morphology without mass Other findings:  No free pelvic fluid.  No adnexal masses. IMPRESSION: Unremarkable ovaries. Thickened distended endometrial canal containing heterogeneous material consistent of fluid, hypoechoic material likely blood, and abnormal echogenic material, small portion of which contains internal blood flow. Findings are highly suspicious for retained products of conception. Electronically Signed   By: Lavonia Dana M.D.   On: 03/07/2020 11:32   [place] MFM OB FOLLOW UP  Result Date: 02/16/2020 ----------------------------------------------------------------------  OBSTETRICS REPORT                       (Signed Final 02/16/2020 09:23 am) ---------------------------------------------------------------------- Patient Info  ID #:       [id number]                          D.O.B.:  Aug 03, 1986 (33 yrs)  Name:       Carol [name]             Visit Date: 02/16/2020 08:57 am ---------------------------------------------------------------------- Performed By  Performed By:     Novella Rob        Ref. Address:     [place]  Attending:        Johnell Comings MD         Location[facility]  Referred By:      [facility] ---------------------------------------------------------------------- Orders   #  Description                          Code         Ordered By   1  [place] MFM OB FOLLOW UP                  [phone]     Peterson Ao  ----------------------------------------------------------------------   #  Order #                    Accession #                 Episode #   1  [id number]                  [id number]                  [id number]   ---------------------------------------------------------------------- Indications   Poor obstetric history: Prior fetal            O09.299   macrosomia, prior preeclampsia (ASA)   Rh negative state in antepartum                O36.0190   [redacted] weeks gestation of pregnancy                Z3A.37  ---------------------------------------------------------------------- Fetal Evaluation  Num Of Fetuses:         1  Cardiac Activity:       Observed  Presentation:           Cephalic  Placenta:               Anterior  P. Cord Insertion:      Previously Visualized  Amniotic Fluid  AFI FV:      Within normal limits  AFI Sum(cm)     %Tile       Largest Pocket(cm)  23.64           93          6.86  RUQ(cm)       RLQ(cm)       LUQ(cm)        LLQ(cm)  6.86          6.15          6.03           4.6 ---------------------------------------------------------------------- Biometry  BPD:      91.9  mm     G. Age:  37w 2d         63  %    CI:        76.01   %    70 - 86                                                          FL/HC:      21.8   %    20.9 - 22.7  HC:      334.1  mm     G. Age:  38w 1d         42  %    HC/AC:      0.88  0.92 - 1.05  AC:      379.6  mm     G. Age:  41w 6d       > 99  %    FL/BPD:     79.2   %    71 - 87  FL:       72.8  mm     G. Age:  37w 2d         43  %    FL/AC:      19.2   %    20 - 24  Est. FW:    3942  gm    8 lb 11 oz      98  % ---------------------------------------------------------------------- OB History  Gravidity:    4         Term:   2        Prem:   0        SAB:   1  TOP:          9       Ectopic:  9        Living: 2 ---------------------------------------------------------------------- Gestational Age  LMP:           35w 5d        Date:  06/11/19                 EDD:   03/17/20  U/S Today:     38w 5d                                        EDD:   02/25/20  Best:          37w 4d     Det. By:  Previous Ultrasound      EDD:   03/04/20                                      (10/24/19)  ---------------------------------------------------------------------- Anatomy  Cranium:               Appears normal         Aortic Arch:            Previously seen  Cavum:                 Appears normal         Ductal Arch:            Previously seen  Ventricles:            Appears normal         Diaphragm:              Previously seen  Choroid Plexus:        Previously seen        Stomach:                Appears normal, left  sided  Cerebellum:            Previously seen        Abdomen:                Appears normal  Posterior Fossa:       Previously seen        Abdominal Wall:         Previously seen  Nuchal Fold:           Previously seen        Cord Vessels:           Previously seen  Face:                  Orbits and profile     Kidneys:                Appear normal                         previously seen  Lips:                  Previously seen        Bladder:                Appears normal  Thoracic:              Appears normal         Spine:                  Previously seen  Heart:                 Previously seen        Upper Extremities:      Previously seen  RVOT:                  Previously seen        Lower Extremities:      Previously seen  LVOT:                  Previously seen  Other:  Heels/feet and open hands/5th digits prev visualized. Nasal bone          prev visualized. ---------------------------------------------------------------------- Cervix Uterus Adnexa  Cervix  Not visualized (advanced GA >24wks) ---------------------------------------------------------------------- Comments  This patient was seen for a follow up growth scan due to  concerns regarding fetal macrosomia.  She reports that she  has screened negative for gestational diabetes in her current  pregnancy.  She reports that her last baby was delivered at  term weighing 8 pounds 12 ounces.  She delivered that child  without any difficulty.  She was informed that  the fetal growth continues to measure  in the larger normal range for her gestational age.  There was  normal amniotic fluid noted.  Fetal movements and fetal  breathing movements were noted throughout today's exam.  She was advised that based on the EFW obtained today, this  baby will most likely weigh very similar to her last child should  she deliver in the next 7 to 10 days.  No further exams were scheduled in our office. ----------------------------------------------------------------------                   Johnell Comings, MD Electronically Signed Final Report   02/16/2020 09:23 am ----------------------------------------------------------------------  MAU Course  Procedures  MDM -excessive ppartum vaginal bleeding -minimal bleeding on exam -per delivery note, no complications other  than tight nuchal cord, placenta intact, EBL 400cc -CBC: platelets 540, H/H 13.1/39.5 (was 12.0/35.5 10 days ago) -[place]: thickened distended endometrial canal containing heterogeneous material consistent of fluid, hypoechoic material likely blood, and abnormal echogenic material, small portion of which contains internal blood flow. 50mm thickness. Findings are highly suspicious for retained products of conception. -WetPrep: WNL -GC/CT collected -consulted with Dr. Jimmye Norman on [facility] vs. Cytotec, per Dr. Jimmye Norman, will proceed with D&C d/t quantity of retained products -pt reports eating cinnamon roll at 830AM and coffee with creamer at 930AM -care transferred to Dr. Jimmye Norman for [facility]  Orders Placed This Encounter  Procedures  . Wet prep, genital    Standing Status:   Standing    Number of Occurrences:   1  . Respiratory Panel by RT PCR (Flu A&B, Covid) - Nasopharyngeal Swab    Standing Status:   Standing    Number of Occurrences:   1    Order Specific Question:   Is this test for diagnosis or screening    Answer:   Screening    Order Specific Question:   Symptomatic for COVID-19 as defined by CDC    Answer:   No     Order Specific Question:   Hospitalized for COVID-19    Answer:   No    Order Specific Question:   Admitted to ICU for COVID-19    Answer:   No    Order Specific Question:   Previously tested for COVID-19    Answer:   Yes    Order Specific Question:   Resident in a congregate (group) care setting    Answer:   Unknown    Order Specific Question:   Employed in healthcare setting    Answer:   Unknown    Order Specific Question:   Pregnant    Answer:   No  . US PELVIS (TRANSABDOMINAL ONLY)    Standing Status:   Standing    Number of Occurrences:   1    Order Specific Question:   Symptom/Reason for Exam    Answer:   Excessive postpartum bleeding [id number]  . CBC    Standing Status:   Standing    Number of Occurrences:   1    Assessment and Plan   1. Retained products of conception after delivery without hemorrhage   2. Excessive postpartum bleeding   3. Rh negative, delivered, current hospitalization   4. Thrombocytosis ([place])    -admit for D&C, care transferred to Dr. Karmen Stabs E Gretchen Weinfeld 03/07/2020, 12:09 PM

## 2020-03-07 NOTE — Anesthesia Procedure Notes (Signed)
Procedure Name: Intubation Date/Time: 03/07/2020 2:33 PM Performed by: Barrington Ellison, CRNA Pre-anesthesia Checklist: Patient identified, Emergency Drugs available, Suction available and Patient being monitored Patient Re-evaluated:Patient Re-evaluated prior to induction Oxygen Delivery Method: Circle System Utilized Preoxygenation: Pre-oxygenation with 100% oxygen Induction Type: IV induction and Rapid sequence Ventilation: Mask ventilation without difficulty Laryngoscope Size: Mac and 3 Grade View: Grade I Tube type: Oral Tube size: 7.0 mm Number of attempts: 1 Airway Equipment and Method: Stylet and Oral airway Placement Confirmation: ETT inserted through vocal cords under direct vision,  positive ETCO2 and breath sounds checked- equal and bilateral Secured at: 20 cm Tube secured with: Tape Dental Injury: Teeth and Oropharynx as per pre-operative assessment

## 2020-03-07 NOTE — Anesthesia Preprocedure Evaluation (Signed)
Anesthesia Evaluation  Patient identified by MRN, date of birth, ID band Patient awake    Reviewed: Allergy & Precautions, NPO status , Patient's Chart, lab work & pertinent test results  Airway Mallampati: II  TM Distance: >3 FB Neck ROM: Full    Dental  (+) Dental Advisory Given   Pulmonary former smoker,    breath sounds clear to auscultation       Cardiovascular hypertension,  Rhythm:Regular Rate:Normal     Neuro/Psych  Headaches,    GI/Hepatic Neg liver ROS, GERD  Medicated,  Endo/Other  negative endocrine ROS  Renal/GU negative Renal ROS     Musculoskeletal  (+) Arthritis ,   Abdominal   Peds  Hematology negative hematology ROS (+)   Anesthesia Other Findings   Reproductive/Obstetrics                             Lab Results  Component Value Date   WBC 10.8 (H) 03/07/2020   HGB 13.1 03/07/2020   HCT 39.5 03/07/2020   MCV 88.6 03/07/2020   PLT 540 (H) 03/07/2020   Lab Results  Component Value Date   CREATININE 0.52 02/26/2020   BUN 5 (L) 02/26/2020   NA 137 02/26/2020   K 3.0 (L) 02/26/2020   CL 104 02/26/2020   CO2 21 (L) 02/26/2020    Anesthesia Physical Anesthesia Plan  ASA: II and emergent  Anesthesia Plan: General   Post-op Pain Management:    Induction: Intravenous and Rapid sequence  PONV Risk Score and Plan: 3 and Dexamethasone, Ondansetron, Treatment may vary due to age or medical condition and Scopolamine patch - Pre-op  Airway Management Planned: Oral ETT  Additional Equipment: None  Intra-op Plan:   Post-operative Plan: Extubation in OR  Informed Consent: I have reviewed the patients History and Physical, chart, labs and discussed the procedure including the risks, benefits and alternatives for the proposed anesthesia with the patient or authorized representative who has indicated his/her understanding and acceptance.     Dental advisory  given  Plan Discussed with: CRNA  Anesthesia Plan Comments:         Anesthesia Quick Evaluation

## 2020-03-07 NOTE — Op Note (Signed)
03/07/2020  4:05 PM  PATIENT:  Carol [name]  34 y.o. female  PRE-OPERATIVE DIAGNOSIS:  retained placenta  POST-OPERATIVE DIAGNOSIS:  retained placenta, postpartum hemorrhage   PROCEDURE:  Procedure(s): DILATATION AND EVACUATIONf with ULTRASOUND GUIDANCE (N/A)  SURGEON:  Surgeon(s) and Role:    * Cherre Blanc, MD - Primary  ANESTHESIA:   general  EBL:  4000 mL   BLOOD ADMINISTERED:2 units CC PRBC  DRAINS: none   LOCAL MEDICATIONS USED:  NONE  SPECIMEN:  Source of Specimen:  placenta, from uterine cavity   DISPOSITION OF SPECIMEN:  PATHOLOGY  COUNTS:  YES  TOURNIQUET:  * No tourniquets in log *  DICTATION: .Note written in EPIC  PLAN OF CARE: Admit for overnight observation  PATIENT DISPOSITION:  PACU - hemodynamically stable.   PROCEDURE:  The patient was taken to the operating room where a general anesthetic was administered. She was then positioned in the dorsal lithotomy position and prepped and draped in the normal sterile fashion. Once the anesthetic was found to be adequate, a bimanual exam was performed under anesthetic. Next, a bivalve speculum was placed in the vagina. The anterior lip of cervix was grasped with the vulsellum tenaculum and due to the patient already being dilated approximately 2 cm, no cervical dilation was needed.  Uterus was sounded to 14.5cm  Sharp curettage was used initially under ultrasound guidance. A size 12 straight suction curette was used and connected to the suction and was placed in the cervix and a suction curettage was performed. Two passes were made with the suction curettage.  Heavy brisk bleeding was noted from the os. Pitocin, misoprostol, TXA, and methergine were given to help the uterus contract down.  She kept bleeding and small area of tissue was noted in fundal horn. Sharp curettage was used to obtain the tissue.  Next, a sharp curettage was performed obtaining a small amount of tissue and this was followed by  alternating suction curettage and then sharp curettage until bleeding improved.  Sharp curettage was performed, which revealed a good uterine cry on all sides of the uterus. After the procedure, the vulsellum tenaculum was removed. The cervix was seemed to be hemostatic. The bivalve speculum was removed. The patient was given 0.25 mg of Methergine IM approximately half-way through the procedure. After the procedure, a second bimanual exam was performed and the patient's uterus had significantly decreased in size. It is now approximately12-week size. The patient was taken from the operating room in stable condition after she was cleaned. Due to hemorrhage, pt has stat CBC ordered, 2u held for transfusion.  She will be discharged in AM.  She was given Methergine, Motrin, and doxycycline for her postoperative care. She will follow-up in 1-2 weeks in the office.

## 2020-03-07 NOTE — Anesthesia Postprocedure Evaluation (Signed)
Anesthesia Post Note  Patient: Carol [name]  Procedure(s) Performed: DILATATION AND EVACUATIONf with ULTRASOUND GUIDANCE (N/A Vagina )     Patient location during evaluation: PACU Anesthesia Type: General Level of consciousness: awake Pain management: pain level controlled Vital Signs Assessment: post-procedure vital signs reviewed and stable Respiratory status: spontaneous breathing Cardiovascular status: stable Postop Assessment: no apparent nausea or vomiting Anesthetic complications: no    Last Vitals:  Vitals:   03/07/20 1615 03/07/20 1630  BP: 106/75 106/63  Pulse: 71 66  Resp: 14 14  Temp:    SpO2: 100% 100%    Last Pain:  Vitals:   03/07/20 1615  TempSrc:   PainSc: 5    Pain Goal: Patients Stated Pain Goal: 3 (03/07/20 1410)                 Huston Foley

## 2020-03-08 LAB — CBC
HCT: 22.1 % — ABNORMAL LOW (ref 36.0–46.0)
HCT: 21.4 % — ABNORMAL LOW (ref 36.0–46.0)
HCT: 26.3 % — ABNORMAL LOW (ref 36.0–46.0)
Hemoglobin: 7.4 g/dL — ABNORMAL LOW (ref 12.0–15.0)
Hemoglobin: 7.1 g/dL — ABNORMAL LOW (ref 12.0–15.0)
Hemoglobin: 8.7 g/dL — ABNORMAL LOW (ref 12.0–15.0)
MCH: 30.3 pg (ref 26.0–34.0)
MCH: 30.1 pg (ref 26.0–34.0)
MCH: 29.3 pg (ref 26.0–34.0)
MCHC: 33.5 g/dL (ref 30.0–36.0)
MCHC: 33.2 g/dL (ref 30.0–36.0)
MCHC: 33.1 g/dL (ref 30.0–36.0)
MCV: 90.6 fL (ref 80.0–100.0)
MCV: 90.7 fL (ref 80.0–100.0)
MCV: 88.6 fL (ref 80.0–100.0)
Platelets: 425 10*3/uL — ABNORMAL HIGH (ref 150–400)
Platelets: 414 10*3/uL — ABNORMAL HIGH (ref 150–400)
Platelets: 379 10*3/uL (ref 150–400)
RBC: 2.44 MIL/uL — ABNORMAL LOW (ref 3.87–5.11)
RBC: 2.36 MIL/uL — ABNORMAL LOW (ref 3.87–5.11)
RBC: 2.97 MIL/uL — ABNORMAL LOW (ref 3.87–5.11)
RDW: 13 % (ref 11.5–15.5)
RDW: 13.2 % (ref 11.5–15.5)
RDW: 13.8 % (ref 11.5–15.5)
WBC: 11.4 10*3/uL — ABNORMAL HIGH (ref 4.0–10.5)
WBC: 10.6 10*3/uL — ABNORMAL HIGH (ref 4.0–10.5)
WBC: 9.2 10*3/uL (ref 4.0–10.5)
nRBC: 0 % (ref 0.0–0.2)
nRBC: 0 % (ref 0.0–0.2)
nRBC: 0 % (ref 0.0–0.2)

## 2020-03-08 LAB — GC/CHLAMYDIA PROBE AMP (~~LOC~~) NOT AT ARMC
Chlamydia: NEGATIVE
Comment: NEGATIVE
Comment: NORMAL
Neisseria Gonorrhea: NEGATIVE

## 2020-03-08 LAB — PREPARE RBC (CROSSMATCH)

## 2020-03-08 MED ORDER — IBUPROFEN 800 MG PO TABS: 800.0000 mg | ORAL_TABLET | Freq: Three times a day (TID) | ORAL | 0 refills | Status: AC

## 2020-03-08 MED ORDER — DIPHENHYDRAMINE HCL 25 MG PO CAPS
25.0000 mg | ORAL_CAPSULE | Freq: Once | ORAL | Status: AC
  Administered 2020-03-08: 10:00:00 25 mg via ORAL
  Filled 2020-03-08: qty 1

## 2020-03-08 MED ORDER — FERROUS SULFATE 325 (65 FE) MG PO TBEC: 325.0000 mg | DELAYED_RELEASE_TABLET | Freq: Two times a day (BID) | ORAL | 1 refills | Status: AC

## 2020-03-08 MED ORDER — BUTALBITAL-APAP-CAFFEINE 50-325-40 MG PO TABS: 1.0000 | ORAL_TABLET | ORAL | 0 refills | Status: AC | PRN

## 2020-03-08 MED ORDER — OXYCODONE-ACETAMINOPHEN 5-325 MG PO TABS: 1.0000 | ORAL_TABLET | Freq: Four times a day (QID) | ORAL | 0 refills | Status: AC | PRN

## 2020-03-08 MED ORDER — ACETAMINOPHEN 325 MG PO TABS
650.0000 mg | ORAL_TABLET | Freq: Once | ORAL | Status: AC
  Administered 2020-03-08: 650 mg via ORAL
  Filled 2020-03-08: qty 2

## 2020-03-08 MED ORDER — BUTALBITAL-APAP-CAFFEINE 50-325-40 MG PO TABS
1.0000 | ORAL_TABLET | ORAL | Status: DC | PRN
  Administered 2020-03-08: 1 via ORAL
  Filled 2020-03-08: qty 1

## 2020-03-08 MED ORDER — SODIUM CHLORIDE 0.9% IV SOLUTION: Freq: Once | INTRAVENOUS | Status: AC

## 2020-03-08 NOTE — Progress Notes (Signed)
OB Post Op Check Note  03/08/2020 - 10:04 AM  Brief Clinical History: 34 y.o. POD#1 s/p u/s guided suction d&c (EBL 4000 mL) for retained POCs after 3/21 vaginal delivery.   PMHx significant for anxiety.  Subjective: patient feeling tired. Min vaginal bleeding overnight, 1 pad change, some dizziness with standing. No complaints otherwise   Objective:   Current Vital Signs 24h Vital Sign Ranges  T 98.4 F (36.9 C) Temp  Avg: 98.4 F (36.9 C)  Min: 98 F (36.7 C)  Max: 99 F (37.2 C)  BP (!) 105/55 BP  Min: 95/50  Max: 109/71  HR (!) 56 Pulse  Avg: 70.9  Min: 56  Max: 99  RR 18 Resp  Avg: 15.8  Min: 10  Max: 20  SaO2 100 % Room Air SpO2  Avg: 98.4 %  Min: 97 %  Max: 100 %       24 Hour I/O Current Shift I/O  Time Ins Outs 03/31 0701 - 04/01 0700 In: 1880 [P.O.:480; I.V.:1150] Out: 4000  04/01 0701 - 04/01 1900 In: -  Out: 100 [Urine:100]    General: NAD Abdomen: soft, nttp, fundus not palpated GU: no gross VB CV: S1, S2 normal, no murmur, rub or gallop, regular rate and rhythm Pulm: CTAB Extremities: no clubbing, cyanosis or edema; SCDs in place Neuro: A&O x 3  Medications: Current Facility-Administered Medications  Medication Dose Route Frequency Provider Last Rate Last Admin  . 0.9 %  sodium chloride infusion (Manually program via Guardrails IV Fluids)   Intravenous Once Aletha Halim, MD      . 0.9 %  sodium chloride infusion (Manually program via Guardrails IV Fluids)   Intravenous Once Juanna Cao T, MD      . acetaminophen (TYLENOL) tablet 650 mg  650 mg Oral Once Juanna Cao T, MD      . diphenhydrAMINE (BENADRYL) capsule 25 mg  25 mg Oral Once Juanna Cao T, MD      . ibuprofen (ADVIL) tablet 800 mg  800 mg Oral Q8H Aletha Halim, MD   800 mg at 03/08/20 0646  . lactated ringers infusion   Intravenous Continuous Aletha Halim, MD 125 mL/hr at 03/08/20 0334 New Bag at 03/08/20 0334  . menthol-cetylpyridinium (CEPACOL) lozenge 3 mg  1 lozenge  Oral Q2H PRN Aletha Halim, MD      . methylergonovine (METHERGINE) injection 0.2 mg  0.2 mg Intramuscular Once Aletha Halim, MD      . morphine 2 MG/ML injection 1-2 mg  1-2 mg Intravenous Q3H PRN Aletha Halim, MD      . ondansetron (ZOFRAN) tablet 4 mg  4 mg Oral Q6H PRN Aletha Halim, MD       Or  . ondansetron (ZOFRAN) injection 4 mg  4 mg Intravenous Q6H PRN Aletha Halim, MD      . oxyCODONE-acetaminophen (PERCOCET/ROXICET) 5-325 MG per tablet 1-2 tablet  1-2 tablet Oral Q4H PRN Aletha Halim, MD      . pantoprazole (PROTONIX) EC tablet 40 mg  40 mg Oral Daily Juanna Cao T, MD   40 mg at 03/07/20 2011  . prenatal multivitamin tablet 1 tablet  1 tablet Oral Q1200 Juanna Cao T, MD      . scopolamine (TRANSDERM-SCOP) 1 MG/3DAYS 1.5 mg  1 patch Transdermal Q72H Aletha Halim, MD   1.5 mg at 03/07/20 1421  . senna-docusate (Senokot-S) tablet 1 tablet  1 tablet Oral QHS PRN Aletha Halim, MD      . simethicone (  MYLICON) chewable tablet 80 mg  80 mg Oral QID PRN Aletha Halim, MD        Laboratory: Recent Labs  Lab 03/07/20 1716 03/07/20 2144 03/08/20 0526  WBC 16.7* 12.1* 11.4*  HGB 10.0* 8.8* 7.4*  HCT 29.6* 26.0* 22.1*  PLT 448* 473* 425*   Radiology: No new imaging  A/P: pt doing well *Routine post op care. Hg trending down, appropriate w/ EBL. Discussed transfusion due to symptomatic anemia.  2u pRBCs, if appropriate rise and meeting goals plan for d/c in PM or tomorrow AM.   Juanna Cao. MD Attending Center for [employer] [facility])

## 2020-03-08 NOTE — Progress Notes (Signed)
Pt discharged home in stable condition. Nurse provided education on reasons to call MD, new medication regimen, follow-up appointment, and opioid risks. Pt verbalized understanding.

## 2020-03-08 NOTE — Discharge Summary (Signed)
OB Discharge Summary     Patient Name: Carol [name] DOB: 05/13/1986 MRN: [id number]  Date of admission: 03/07/2020 Delivering MD: Clarnce Flock   Date of discharge: 03/08/2020  Admitting diagnosis: S/P D&C (status post dilation and curettage) [Z98.890] Retained placenta [O73.0] Intrauterine pregnancy: [redacted]w[redacted]d     Secondary diagnosis:  Active Problems:   S/P D&C (status post dilation and curettage)   Retained placenta  Additional problems: Postpartum hemorrhage      Discharge diagnosis: Retained placenta, s/p suction dilation and curettage                                                                                                Post partum procedures:curettage  Blood transfusion 2 u.   Complications: [id number]  Hospital course:  Onset of Labor With Vaginal Delivery     34 y.o. yo [id number] at [redacted]w[redacted]d was admitted in Active Labor on 03/07/2020. Patient had an uncomplicated labor course, had a delivery of a Viable infant. 02/26/2020  Information for the patient's newborn:  [place], [place] [id number]  Delivery Method: Vaginal, Spontaneous(Filed from Delivery Summary)    Pateint had an uncomplicated postpartum course until PPD9, she presented with heavy vaginal bleeding passage of clots. U/S noted retained placenta. Underwent U/S guided suction D and C. She had PPH resolved with uterotonics.  Her Hg dropped from 13 ->7.7 -> 7.1 EBL 4000. She received 2 units of pRBCs with appropriate rise to 8.7.  She is ambulating, tolerating a regular diet, passing flatus, and urinating well. Patient is discharged home in stable condition on 03/09/20.   Physical exam  Vitals:   03/08/20 1539 03/08/20 1608 03/08/20 1715 03/08/20 1934  BP: (!) 99/57 (!) 98/52 (!) 106/58 106/64  Pulse: 61 (!) 58 (!) 56 64  Resp: 18 18  18   Temp: 98.7 F (37.1 C) 98.4 F (36.9 C)  97.9 F (36.6 C)  TempSrc: Oral Oral  Oral  SpO2: 100% 99% 99% 100%  Weight:      Height:        General: alert Lochia: appropriate Uterine Fundus: firm Incision: N/A DVT Evaluation: No evidence of DVT seen on physical exam. Labs: Lab Results  Component Value Date   WBC 9.2 03/08/2020   HGB 8.7 (L) 03/08/2020   HCT 26.3 (L) 03/08/2020   MCV 88.6 03/08/2020   PLT 379 03/08/2020   CMP Latest Ref Rng & Units 02/26/2020  Glucose 70 - 99 mg/dL 86  BUN 6 - 20 mg/dL 5(L)  Creatinine 0.44 - 1.00 mg/dL 0.52  Sodium 135 - 145 mmol/L 137  Potassium 3.5 - 5.1 mmol/L 3.0(L)  Chloride 98 - 111 mmol/L 104  CO2 22 - 32 mmol/L 21(L)  Calcium 8.9 - 10.3 mg/dL 9.7  Total Protein 6.5 - 8.1 g/dL 5.8(L)  Total Bilirubin 0.3 - 1.2 mg/dL 0.6  Alkaline Phos 38 - 126 U/L 89  AST 15 - 41 U/L 19  ALT 0 - 44 U/L 13    Discharge instruction: per After Visit Summary and "Baby and Me Booklet".  After visit meds:  Allergies as of 03/08/2020  Reactions   Codeine Nausea And Vomiting      Medication List    STOP taking these medications   acetaminophen 500 MG tablet Commonly known as: TYLENOL   NIFEdipine 30 MG 24 hr tablet Commonly known as: PROCARDIA-XL/NIFEDICAL-XL   polyethylene glycol powder 17 GM/SCOOP powder Commonly known as: GLYCOLAX/MIRALAX     TAKE these medications   butalbital-acetaminophen-caffeine 50-325-40 MG tablet Commonly known as: FIORICET Take 1 tablet by mouth every 4 (four) hours as needed for headache.   ferrous sulfate 325 (65 FE) MG EC tablet Take 1 tablet (325 mg total) by mouth 2 (two) times daily with a meal.   FLUoxetine 20 MG tablet Commonly known as: PROZAC Take 20 mg by mouth daily.   ibuprofen 800 MG tablet Commonly known as: ADVIL Take 1 tablet (800 mg total) by mouth every 8 (eight) hours. What changed:   medication strength  how much to take  when to take this   omeprazole 20 MG capsule Commonly known as: PriLOSEC Take 1 capsule (20 mg total) by mouth 2 (two) times daily before a meal.   oxyCODONE-acetaminophen 5-325 MG  tablet Commonly known as: PERCOCET/ROXICET Take 1 tablet by mouth every 6 (six) hours as needed for up to 3 days for moderate pain or severe pain.   prenatal multivitamin Tabs tablet Take 1 tablet by mouth daily at 12 noon.   promethazine 25 MG tablet Commonly known as: PHENERGAN TAKE 1 TABLET (25 MG TOTAL) BY MOUTH EVERY 6 (SIX) HOURS AS NEEDED FOR NAUSEA OR VOMITING.       Diet: routine diet  Activity: Advance as tolerated. Pelvic rest for 6 weeks.   Outpatient follow up:2 weeks Follow up Appt: Future Appointments  Date Time Provider [place]  04/10/2020  9:30 AM Aletha Halim, MD [id number]   Follow up Visit:No follow-ups on file.   03/08/2020 Cherre Blanc, MD

## 2020-03-09 LAB — TYPE AND SCREEN
ABO/RH(D): O NEG
Antibody Screen: POSITIVE
Unit division: 0
Unit division: 0

## 2020-03-09 LAB — BPAM RBC
Blood Product Expiration Date: 202104062359
Blood Product Expiration Date: 202104062359
ISSUE DATE / TIME: 202104011049
ISSUE DATE / TIME: 202104011411
Unit Type and Rh: 9500
Unit Type and Rh: 9500

## 2020-03-09 LAB — SURGICAL PATHOLOGY

## 2020-03-12 ENCOUNTER — Telehealth: Payer: Self-pay | Admitting: *Deleted

## 2020-03-12 NOTE — Telephone Encounter (Signed)
Called pt back in regards to her mychart message. Per Dr Harolyn Rutherford pt does not need to restart her BP meds unless her BP goes back up. Pt can occassionally can check her BP. Pt does not need a repeat a CBC unless her bleeding becomes worse. Pt verbalizes and understands.

## 2020-03-31 ENCOUNTER — Other Ambulatory Visit: Payer: Self-pay | Admitting: Obstetrics & Gynecology

## 2020-04-10 ENCOUNTER — Encounter: Payer: Self-pay | Admitting: Obstetrics and Gynecology

## 2020-04-10 ENCOUNTER — Other Ambulatory Visit (HOSPITAL_COMMUNITY)
Admission: RE | Admit: 2020-04-10 | Discharge: 2020-04-10 | Disposition: A | Discharge status: Home / Self Care | Payer: 59 | Source: Ambulatory Visit | Attending: Obstetrics and Gynecology | Admitting: Obstetrics and Gynecology

## 2020-04-10 ENCOUNTER — Ambulatory Visit (INDEPENDENT_AMBULATORY_CARE_PROVIDER_SITE_OTHER): Payer: 59 | Admitting: Obstetrics and Gynecology

## 2020-04-10 ENCOUNTER — Other Ambulatory Visit: Payer: Self-pay

## 2020-04-10 VITALS — BP 114/74 | HR 62 | Wt 177.0 lb

## 2020-04-10 DIAGNOSIS — Z124 Encounter for screening for malignant neoplasm of cervix: Secondary | ICD-10-CM | POA: Diagnosis present

## 2020-04-10 DIAGNOSIS — Z862 Personal history of diseases of the blood and blood-forming organs and certain disorders involving the immune mechanism: Secondary | ICD-10-CM

## 2020-04-10 LAB — CBC
Hematocrit: 41.7 % (ref 34.0–46.6)
Hemoglobin: 14 g/dL (ref 11.1–15.9)
MCH: 29.9 pg (ref 26.6–33.0)
MCHC: 33.6 g/dL (ref 31.5–35.7)
MCV: 89 fL (ref 79–97)
Platelets: 351 10*3/uL (ref 150–450)
RBC: 4.68 x10E6/uL (ref 3.77–5.28)
RDW: 12.4 % (ref 11.7–15.4)
WBC: 6.6 10*3/uL (ref 3.4–10.8)

## 2020-04-10 NOTE — Progress Notes (Signed)
    [place] Partum Visit Note  Ebenezer Lazarin is a 34 y.o. [id number] who presents for a postpartum visit.   She is s/p 3/21 SVD/intact perineum and she was readmitted on 3/31 for VB and pain and had a d&c later that day that was c/b by EBL of 2L. She was discharged to home on 4/1     I have fully reviewed the prenatal and intrapartum course. The delivery was at 39.0 gestational weeks.  Anesthesia: epidural. Postpartum course has been complicated by retained products and postpartum hemorrhage. Baby is doing well. Baby is feeding by breast. Bleeding no bleeding. Bowel function is normal. Bladder function is normal. Patient is not sexually active. Contraception method is undecided. Postpartum depression screening: negative.   Review of Systems Pertinent items noted in HPI and remainder of comprehensive ROS otherwise negative.    Objective:  Blood pressure 114/74, pulse 62, weight 177 lb (80.3 kg), unknown if currently breastfeeding.  NAD EGBUS normal. Vagina normal. Cervix normal Assessment:    normal  postpartum exam.   Plan:     1.  Mood and well being: no issues  2. Infant care and feeding:  Breast feeding only at least q2-3h. D/w her LAM effectiveness rates with this for first 27months  3. Sexuality, contraception and birth spacing No sex yet. Husband going in for vasectomy consult. Pt used OCPs in the past and may do that again.   4. Sleep and fatigue  5. Physical Recovery  No issues  6.  Health Maintenance Pap done today. Rpt cbc. Pt confirms on iron  RTC PRN  Aletha Halim, MD Center for [employer], [place]

## 2020-04-11 LAB — CYTOLOGY - PAP
Comment: NEGATIVE
Diagnosis: NEGATIVE
High risk HPV: NEGATIVE

## 2020-04-19 ENCOUNTER — Telehealth: Payer: 59 | Admitting: Family

## 2020-04-19 DIAGNOSIS — B9689 Other specified bacterial agents as the cause of diseases classified elsewhere: Secondary | ICD-10-CM | POA: Diagnosis not present

## 2020-04-19 DIAGNOSIS — J019 Acute sinusitis, unspecified: Secondary | ICD-10-CM | POA: Diagnosis not present

## 2020-04-19 MED ORDER — AMOXICILLIN-POT CLAVULANATE 875-125 MG PO TABS: 1.0000 | ORAL_TABLET | Freq: Two times a day (BID) | ORAL | 0 refills | Status: DC

## 2020-04-19 MED ORDER — AMOXICILLIN 500 MG PO CAPS: 500.0000 mg | ORAL_CAPSULE | Freq: Three times a day (TID) | ORAL | 0 refills | Status: DC

## 2020-04-19 NOTE — Addendum Note (Signed)
Addended by: Dutch Quint B on: 04/19/2020 02:34 PM   Modules accepted: Orders

## 2020-04-19 NOTE — Progress Notes (Signed)

## 2020-05-24 ENCOUNTER — Encounter: Payer: Self-pay | Admitting: *Deleted

## 2020-05-25 ENCOUNTER — Other Ambulatory Visit: Payer: Self-pay | Admitting: Obstetrics & Gynecology

## 2020-07-16 ENCOUNTER — Other Ambulatory Visit: Payer: Self-pay | Admitting: *Deleted

## 2020-07-16 DIAGNOSIS — M549 Dorsalgia, unspecified: Secondary | ICD-10-CM

## 2020-08-01 ENCOUNTER — Ambulatory Visit: Payer: 59

## 2020-08-08 ENCOUNTER — Encounter: Payer: 59 | Admitting: Physical Therapy

## 2020-08-08 ENCOUNTER — Ambulatory Visit: Payer: 59 | Admitting: Physical Therapy

## 2020-08-12 ENCOUNTER — Ambulatory Visit: Admit: 2020-08-12 | Discharge status: Against Medical Advice | Payer: 59

## 2020-08-14 ENCOUNTER — Ambulatory Visit: Payer: 59 | Admitting: Physical Therapy

## 2020-08-14 ENCOUNTER — Encounter: Payer: 59 | Admitting: Physical Therapy

## 2020-08-16 ENCOUNTER — Ambulatory Visit: Payer: 59 | Admitting: Physical Therapy

## 2020-08-21 ENCOUNTER — Ambulatory Visit: Payer: 59 | Admitting: Physical Therapy

## 2020-08-23 ENCOUNTER — Encounter: Payer: 59 | Admitting: Physical Therapy

## 2020-08-28 ENCOUNTER — Encounter: Payer: 59 | Admitting: Physical Therapy

## 2020-09-04 ENCOUNTER — Encounter: Payer: 59 | Admitting: Physical Therapy

## 2020-09-10 ENCOUNTER — Encounter: Payer: 59 | Admitting: Physical Therapy

## 2020-09-12 ENCOUNTER — Encounter: Payer: 59 | Admitting: Physical Therapy

## 2020-09-17 ENCOUNTER — Encounter: Payer: 59 | Admitting: Physical Therapy

## 2020-09-19 ENCOUNTER — Encounter: Payer: 59 | Admitting: Physical Therapy

## 2020-12-25 ENCOUNTER — Other Ambulatory Visit: Payer: Self-pay

## 2020-12-25 ENCOUNTER — Ambulatory Visit: Payer: Self-pay

## 2020-12-25 ENCOUNTER — Ambulatory Visit
Admission: EM | Admit: 2020-12-25 | Discharge: 2020-12-25 | Disposition: A | Discharge status: Home / Self Care | Payer: 59 | Attending: Physician Assistant | Admitting: Physician Assistant

## 2020-12-25 ENCOUNTER — Encounter: Payer: Self-pay | Admitting: Emergency Medicine

## 2020-12-25 DIAGNOSIS — L509 Urticaria, unspecified: Secondary | ICD-10-CM | POA: Diagnosis not present

## 2020-12-25 MED ORDER — PREDNISONE 10 MG PO TABS: ORAL_TABLET | ORAL | 0 refills | Status: AC

## 2020-12-25 NOTE — ED Triage Notes (Signed)
Patient in today c/o hives x 3 days. Patient went to [facility] ED on 12/22/20 and was treated with an EPI pen, Zyrtec and Pepcid. Patient was instructed to continue Zyrtec twice daily, Pepcid once daily and Benadryl prn at home. Patient states she is still having itching, hives and swelling.

## 2020-12-25 NOTE — Discharge Instructions (Signed)
Breast-feed 4 hours after taking the prednisone.  You can take a antihistamines as you have been taking.  Try to ice your feet and elevate them to help with swelling.  Follow-up with our department as needed.  Go back to ED if you have severe acute worsening of your hives or he have any difficulty breathing or swallowing.

## 2020-12-25 NOTE — ED Provider Notes (Signed)
[facility]    CSN: [id number] Arrival date & time: 12/25/20  1144      History   Chief Complaint Chief Complaint  Patient presents with  . Urticaria    HPI Carol [name] is a 35 y.o. female presenting for hives of the extremities and torso x 4 days. Patient was seen at [facility] ED 3 days ago and given epi pen and advised to take benadryl and zyrtec. Patient says hives have improved but remain. Admits to swelling of feet and pain of feet. Admits to full body itching.   Patient denies any known allergies and says she has had hives in the past due to stress or viral illnesses, but it usually always resolves with antihistamines.    Patient denies any associated facial swelling today.  She states that she did have swelling of her eyelids at onset.  She denies any swelling of her mouth or throat.  Denies chest tightness or difficulty swallowing.  Patient has no other complaints or concerns.  HPI  Past Medical History:  Diagnosis Date  . Anxiety   . Depression    doing ok  . LGA (large for gestational age) fetus affecting management of mother 11/01/2019   @ 19 wks (10/24/19) -early a1c neg  . Migraines   . Retained placenta 03/08/2020  . S/P D&C (status post dilation and curettage) 03/07/2020  . Severe preeclampsia in postpartum period 08/23/2019   Approximately 1 week after first baby delivered. Severe. Started on hctz and procardia   . SVD (spontaneous vaginal delivery)    x 3 - last del 02/26/20  . Vaginal Pap smear, abnormal    cryo therapy, ok since    Patient Active Problem List   Diagnosis Date Noted  . GAD (generalized anxiety disorder) 09/26/2019  . Allergic rhinosinusitis 09/26/2019  . Abnormal gallbladder ultrasound 04/26/2018  . Heart burn 02/03/2018  . Migraines   . Chronic headache disorder 04/22/2017  . 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 Osteoarthritis 04/22/2017    Past Surgical History:  Procedure Laterality Date  .  CRYOTHERAPY  2012  . DILATION AND EVACUATION N/A 03/07/2020   Procedure: DILATATION AND EVACUATIONf with ULTRASOUND GUIDANCE;  Surgeon: Cherre Blanc, MD;  Location: [place];  Service: Gynecology;  Laterality: N/A;    OB History    Gravida  4   Para  3   Term  3   Preterm      AB  1   Living  3     SAB  1   IAB      Ectopic      Multiple  0   Live Births  3            Home Medications    Prior to Admission medications   Medication Sig Start Date End Date Taking? Authorizing Provider  FLUoxetine (PROZAC) 20 MG tablet Take 20 mg by mouth daily.   Yes [provider]  ibuprofen (ADVIL) 800 MG tablet Take 1 tablet (800 mg total) by mouth every 8 (eight) hours. 03/08/20  Yes Cherre Blanc, MD  predniSONE (DELTASONE) 10 MG tablet Take 6 tablets by mouth on the first day and decrease by 1 tablet daily for the next 5 days 12/25/20  Yes Danton Clap, PA-C  Prenatal Vit-Fe Fumarate-FA (PRENATAL MULTIVITAMIN) TABS tablet Take 1 tablet by mouth daily at 12 noon.   Yes [provider]  amoxicillin (AMOXIL) 500 MG capsule Take 1 capsule (500 mg total)  by mouth 3 (three) times daily. 04/19/20   Eulis Foster, FNP  amoxicillin-clavulanate (AUGMENTIN) 875-125 MG tablet Take 1 tablet by mouth 2 (two) times daily. 04/19/20   Worthy Rancher B, FNP  butalbital-acetaminophen-caffeine (FIORICET) [phone] MG tablet Take 1 tablet by mouth every 4 (four) hours as needed for headache. 03/08/20   Malachy Chamber, MD  ferrous sulfate 325 (65 FE) MG EC tablet Take 1 tablet (325 mg total) by mouth 2 (two) times daily with a meal. 03/08/20   Malachy Chamber, MD  omeprazole (PRILOSEC) 20 MG capsule Take 1 capsule (20 mg total) by mouth 2 (two) times daily before a meal. Patient not taking: No sig reported 11/15/19   Hermina Staggers, MD  promethazine (PHENERGAN) 25 MG tablet TAKE 1 TABLET (25 MG TOTAL) BY MOUTH EVERY 6 (SIX) HOURS AS NEEDED FOR NAUSEA OR VOMITING. 12/15/19    Anyanwu, Jethro Bastos, MD    Family History Family History  Problem Relation Age of Onset  . Lung disease Mother   . Asthma Mother   . Cancer Mother        skin  . Gout Father   . Heart disease Father        a-fib  . Cancer Maternal Grandmother        breast, pancreatic  . Cancer Maternal Grandfather        colon  . Heart disease Paternal Grandfather     Social History Social History   Tobacco Use  . Smoking status: Former Smoker    Quit date: 12/25/2010    Years since quitting: 10.0  . Smokeless tobacco: Never Used  . Tobacco comment: prior to first preg  Vaping Use  . Vaping Use: Never used  Substance Use Topics  . Alcohol use: Yes    Comment: rare  . Drug use: No     Allergies   Codeine   Review of Systems Review of Systems  Constitutional: Negative for fatigue and fever.  HENT: Negative for trouble swallowing and voice change.   Respiratory: Negative for chest tightness, shortness of breath and wheezing.   Gastrointestinal: Negative for nausea.  Skin: Positive for rash.  Neurological: Negative for weakness.     Physical Exam Triage Vital Signs ED Triage Vitals  Enc Vitals Group     BP 12/25/20 1219 112/65     Pulse Rate 12/25/20 1219 75     Resp 12/25/20 1219 18     Temp 12/25/20 1219 98.5 F (36.9 C)     Temp Source 12/25/20 1219 Oral     SpO2 12/25/20 1219 100 %     Weight 12/25/20 1220 169 lb (76.7 kg)     Height 12/25/20 1220 5\' 7"  (1.702 m)     Head Circumference --      Peak Flow --      Pain Score 12/25/20 1219 7     Pain Loc --      Pain Edu? --      Excl. in GC? --    No data found.  Updated Vital Signs BP 112/65 (BP Location: Left Arm)   Pulse 75   Temp 98.5 F (36.9 C) (Oral)   Resp 18   Ht 5\' 7"  (1.702 m)   Wt 169 lb (76.7 kg)   LMP 12/11/2020 (Approximate)   SpO2 100%   Breastfeeding Yes   BMI 26.47 kg/m       Physical Exam Vitals and nursing note reviewed.  Constitutional:  General: She is not in acute  distress.    Appearance: Normal appearance. She is not ill-appearing or toxic-appearing.  HENT:     Head: Normocephalic and atraumatic.     Comments: No facial or mouth swelling    Nose: Nose normal.     Mouth/Throat:     Mouth: Mucous membranes are moist.     Pharynx: Oropharynx is clear.  Eyes:     General: No scleral icterus.       Right eye: No discharge.        Left eye: No discharge.     Conjunctiva/sclera: Conjunctivae normal.  Cardiovascular:     Rate and Rhythm: Normal rate and regular rhythm.     Heart sounds: Normal heart sounds.  Pulmonary:     Effort: Pulmonary effort is normal. No respiratory distress.     Breath sounds: Normal breath sounds.  Musculoskeletal:     Cervical back: Neck supple.  Skin:    General: Skin is dry.     Findings: Rash (faint urticaria of feet, chest and legs) present.  Neurological:     General: No focal deficit present.     Mental Status: She is alert. Mental status is at baseline.     Motor: No weakness.     Gait: Gait normal.  Psychiatric:        Mood and Affect: Mood normal.        Behavior: Behavior normal.        Thought Content: Thought content normal.      UC Treatments / Results  Labs (all labs ordered are listed, but only abnormal results are displayed) Labs Reviewed - No data to display  EKG   Radiology No results found.  Procedures Procedures (including critical care time)  Medications Ordered in UC Medications - No data to display  Initial Impression / Assessment and Plan / UC Course  I have reviewed the triage vital signs and the nursing notes.  Pertinent labs & imaging results that were available during my care of the patient were reviewed by me and considered in my medical decision making (see chart for details).   35 year old female with urticaria x4 days.  Symptoms have improved but not resolved with antihistamines.  Advised her to continue antihistamines and I also prescribed prednisone.  She is  breast-feeding mother still advised her to take the prednisone 4 hours before breast-feeding.  Advised to rest and increase fluid intake.  Advised elevating feet to help with swelling.  I did not really notice any swelling on exam, but patient feels like they are swollen.  ED precautions reviewed patient.   Final Clinical Impressions(s) / UC Diagnoses   Final diagnoses:  Urticaria     Discharge Instructions     Breast-feed 4 hours after taking the prednisone.  You can take a antihistamines as you have been taking.  Try to ice your feet and elevate them to help with swelling.  Follow-up with our department as needed.  Go back to ED if you have severe acute worsening of your hives or he have any difficulty breathing or swallowing.    ED Prescriptions    Medication Sig Dispense Auth. Provider   predniSONE (DELTASONE) 10 MG tablet Take 6 tablets by mouth on the first day and decrease by 1 tablet daily for the next 5 days 21 tablet Danton Clap, PA-C     PDMP not reviewed this encounter.   Danton Clap, PA-C 12/25/20 1252

## 2021-11-28 DIAGNOSIS — B3731 Acute candidiasis of vulva and vagina: Secondary | ICD-10-CM | POA: Diagnosis not present

## 2022-01-03 DIAGNOSIS — K209 Esophagitis, unspecified without bleeding: Secondary | ICD-10-CM | POA: Diagnosis not present

## 2022-01-03 DIAGNOSIS — K219 Gastro-esophageal reflux disease without esophagitis: Secondary | ICD-10-CM | POA: Diagnosis not present

## 2022-01-03 DIAGNOSIS — I10 Essential (primary) hypertension: Secondary | ICD-10-CM | POA: Diagnosis not present

## 2022-01-03 DIAGNOSIS — Z791 Long term (current) use of non-steroidal anti-inflammatories (NSAID): Secondary | ICD-10-CM | POA: Diagnosis not present

## 2022-01-03 DIAGNOSIS — K2970 Gastritis, unspecified, without bleeding: Secondary | ICD-10-CM | POA: Diagnosis not present

## 2022-01-03 DIAGNOSIS — K2289 Other specified disease of esophagus: Secondary | ICD-10-CM | POA: Diagnosis not present

## 2022-01-03 DIAGNOSIS — R1013 Epigastric pain: Secondary | ICD-10-CM | POA: Diagnosis not present

## 2022-01-03 DIAGNOSIS — R1011 Right upper quadrant pain: Secondary | ICD-10-CM | POA: Diagnosis not present

## 2022-01-24 DIAGNOSIS — L309 Dermatitis, unspecified: Secondary | ICD-10-CM | POA: Diagnosis not present

## 2022-02-12 DIAGNOSIS — F419 Anxiety disorder, unspecified: Secondary | ICD-10-CM | POA: Diagnosis not present

## 2022-02-12 DIAGNOSIS — B354 Tinea corporis: Secondary | ICD-10-CM | POA: Diagnosis not present

## 2022-02-12 DIAGNOSIS — E663 Overweight: Secondary | ICD-10-CM | POA: Diagnosis not present

## 2022-03-04 DIAGNOSIS — J069 Acute upper respiratory infection, unspecified: Secondary | ICD-10-CM | POA: Diagnosis not present

## 2022-03-13 DIAGNOSIS — M545 Low back pain, unspecified: Secondary | ICD-10-CM | POA: Diagnosis not present

## 2022-03-26 DIAGNOSIS — M545 Low back pain, unspecified: Secondary | ICD-10-CM | POA: Diagnosis not present

## 2022-05-14 NOTE — Therapy (Signed)
OUTPATIENT PHYSICAL THERAPY THORACOLUMBAR EVALUATION   Patient Name: Carol [name] MRN: [id number] DOB:1986-11-17, 36 y.o., female Today's Date: 05/15/2022   PT End of Session - 05/15/22 0804     Visit Number 1    Number of Visits 17    Date for PT Re-Evaluation 07/10/22    Authorization Type eval: 05/15/22    PT Start Time 0805    PT Stop Time 0845    PT Time Calculation (min) 40 min    Activity Tolerance Patient tolerated treatment well    Behavior During Therapy [facility] for tasks assessed/performed             Past Medical History:  Diagnosis Date   Anxiety    Depression    doing ok   LGA (large for gestational age) fetus affecting management of mother 11/01/2019   @ 19 wks (10/24/19) -early a1c neg   Migraines    Retained placenta 03/08/2020   S/P D&C (status post dilation and curettage) 03/07/2020   Severe preeclampsia in postpartum period 08/23/2019   Approximately 1 week after first baby delivered. Severe. Started on hctz and procardia    SVD (spontaneous vaginal delivery)    x 3 - last del 02/26/20   Vaginal Pap smear, abnormal    cryo therapy, ok since   Past Surgical History:  Procedure Laterality Date   CRYOTHERAPY  2012   DILATION AND EVACUATION N/A 03/07/2020   Procedure: DILATATION AND EVACUATIONf with ULTRASOUND GUIDANCE;  Surgeon: Cherre Blanc, MD;  Location: [place];  Service: Gynecology;  Laterality: N/A;   Patient Active Problem List   Diagnosis Date Noted   GAD (generalized anxiety disorder) 09/26/2019   Allergic rhinosinusitis 09/26/2019   Abnormal gallbladder ultrasound 04/26/2018   Heart burn 02/03/2018   Migraines    Chronic headache disorder 04/22/2017   Cervicocranial syndrome 04/22/2017   Osteoarthritis 04/22/2017    PCP: Pleas Koch, NP  REFERRING PROVIDER: Pleas Koch, NP  REFERRING DIAGNOSIS: M54.9 (ICD-10-CM) - Back pain, unspecified back location, unspecified back pain laterality, unspecified  chronicity  THERAPY DIAG: Other low back pain  RATIONALE FOR EVALUATION AND TREATMENT: Rehabilitation  ONSET DATE: 03/08/22 (approximate)  FOLLOW UP APPT WITH PROVIDER: No    SUBJECTIVE:                                                                                                                                                                                         Chief Complaint: Midline low back pain   Pertinent History Pt reports intermittent chronic back pain since having her kids. She had bad L sided sciatica during her pregnancies however  these symptoms resolved after delivery. However most recently at the beginning of April her son fell and stopped walking for a week. She had to carry him everywhere which resulted in onset of most recent episode of back pain. Pain has remained unchanged since that time. Pain is a constant dull ache but she gets intermittent stabbing pain with lifting or when flexing her hips when putting her legs in her pants. Pain is midline and radiates down slightly toward her tailbone. She has utilized icy/hot patches, OTC analgesics, and muscle relaxers with minimal benefit. She states that her husband will occasionally use their percussion massager on her back which is uncomfortable in the moment but helps afterward.  Pt states that she had an abdominoplasty to correct a significant diastasis after her last child was born. Pt denies any abnormal vaginal bleeding or pain with intercourse. She endorses stress incontinence since pregnancy.      Pain:  Pain Intensity: Present: 5/10, Best: 0/10, Worst: 8/10 Pain location: midline low back Pain Quality: dull with intermittent sharp episodes Radiating: Yes occasionally radiates down toward her sacrum but never into LEs; Numbness/Tingling: No Focal Weakness: No Aggravating factors: lifting kids, flexing hip to put on pants, sitting for an extended period (at work), pressing on her back; Relieving factors: icy/hot  patches, muscle relaxers, Tylenol, heating pad, percussion massage (painful during but relieves pain afterward) 24-hour pain behavior: worse in the AM and then at night again How long can you sit: unlimited but painful How long can you stand: no limitation History of prior back injury, pain, surgery, or therapy: No Follow-up appointment with MD: No, nothing scheduled, advised to follow-up as needed Dominant hand: left Imaging: No  Prior level of function: Independent Occupational demands: Works as a Education officer, [employer], 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 for extended period of time behind a desk; Hobbies: walking in the neighborhood, going to the pool, watching movies, time with family Red flags (bowel/bladder changes, saddle paresthesia, personal history of cancer, h/o spinal tumors, h/o compression fx, h/o abdominal aneurysm, abdominal pain, chills/fever, night sweats, nausea, vomiting, unrelenting pain): Negative  Precautions: None  Weight Bearing Restrictions: No  Living Environment Lives with: lives with their family Lives in: House/apartment  Patient Goals: Pt would like to be able to put on her pants without having to support herself, pick up her 36 year old without pain;   OBJECTIVE:   Patient Surveys  Modified Oswestry 28%  FOTO 63, predicted improvement to 30   Cognition Patient is oriented to person, place, and time.  Recent memory is intact.  Remote memory is intact.  Attention span and concentration are intact.  Expressive speech is intact.  Patient's fund of knowledge is within normal limits for educational level.     Gross Musculoskeletal Assessment Tremor: None Bulk: Normal Tone: Normal No visible step-off along spinal column, no signs of scoliosis during forward bend   GAIT: Deferred   Posture: Lumbar lordosis: WNL Iliac crest height: Equal bilaterally Lumbar lateral shift: Negative    AROM  AROM (Normal range in  degrees) AROM  05/15/2022  Lumbar   Flexion (65) WNL*  Extension (30) WNL*  Right lateral flexion (25) WNL*  Left lateral flexion (25) WNL*  Right rotation (30) WNL*  Left rotation (30) WNL*      Hip Right Left  Flexion (125) WNL WNL  Extension (15) WNL* WNL*  Abduction (40)    Adduction     Internal Rotation (45)  45* 45*  External Rotation (45) 55* 45*      Knee    Flexion (135) WNL WNL  Extension (0) WNL WNL      Ankle    Dorsiflexion (20)    Plantarflexion (50)    Inversion (35)    Eversion (15    (* = pain; Blank rows = not tested)   LE MMT:  MMT (out of 5) Right 05/15/2022 Left 05/15/2022  Hip flexion 5* 5*  Hip extension 5* 5*  Hip abduction 4+ 4+  Hip adduction 4+ 4+  Hip internal rotation 5* 5*  Hip external rotation 5* 5*  Knee flexion 4+* 4+*  Knee extension 5 5  Ankle dorsiflexion 5 5  Ankle plantarflexion    Ankle inversion    Ankle eversion    (* = pain; Blank rows = not tested)   Sensation Grossly intact to light touch bilateral LEs as determined by testing dermatomes L2-S2. Proprioception, and hot/cold testing deferred on this date.   Reflexes R/L Knee Jerk (L3/4): 3+/3+  Ankle Jerk (S1/2): 2+/2+    Muscle Length Hamstrings: R: Negative around 90 degrees L: Negative around 90 degrees Ely (quadriceps): R: Positive for reproduction of low back pain L: Positive for reproduction of low back pain Thomas (hip flexors): R: Not examined L: Not examined Ober: R: Not examined L: Not examined   Palpation  Location LEFT  RIGHT           Lumbar paraspinals 1 1  Quadratus Lumborum 1 1  Gluteus Maximus 1 1  Gluteus Medius 1 1  Deep hip external rotators 1 1  PSIS 1 1  Fortin's Area (SIJ) 1 1  Greater Trochanter 0 0  (Blank rows = not tested) Graded on 0-4 scale (0 = no pain, 1 = pain, 2 = pain with wincing/grimacing/flinching, 3 = pain with withdrawal, 4 = unwilling to allow palpation), (Blank rows = not tested)   Passive Accessory  Intervertebral Motion Pt reports reproduction of back pain with CPA L1-L5 and UPA bilaterally but more significant pain from L3-L5. Unable to fully assess mobility secondary to pain. Pain with sacral thrust    SPECIAL TESTS Lumbar Radiculopathy and Discogenic: Centralization and Peripheralization (SN 92, -LR 0.12): Not examined Slump (SN 83, -LR 0.32): R: Negative L: Negative SLR (SN 92, -LR 0.29): R: Negative L:  Negative Crossed SLR (SP 90): R: Negative L: Negative  Facet Joint: Extension-Rotation (SN 100, -LR 0.0): R: Negative L: Negative  Lumbar Foraminal Stenosis: Lumbar quadrant (SN 70): R: Negative L: Negative  Hip: FABER (SN 81): R: Negative L: Negative FADIR (SN 94): R: Negative L: Negative Hip scour (SN 50): R: Positive for back pain L: Positive for back pain Figure 4: R: Positive for back pain L: Positive for back pain  SIJ:  Thigh Thrust (SN 88, -LR 0.18) : R: Not examined L: Not examined Sacral Thrust: Positive  Piriformis Syndrome: FAIR Test (SN 88, SP 83): R: Not examined L: Not examined  Functional Tasks Deferred  Beighton scale: Deferred to future session, pt denies hypermobility    TODAY'S TREATMENT  None, issued HEP    PATIENT EDUCATION:  Education details: HEP provided, plan of care discussed, role of deep abdominal stabilizers Person educated: Patient Education method: Explanation, Tactile cues, Verbal cues, and Handouts Education comprehension: verbalized understanding and returned demonstration   HOME EXERCISE PROGRAM: Access Code: [email or web address] URL: [email or web address] Date: 05/15/2022 Prepared by: Roxana Hires  Exercises - Supine Pelvic Tilt  - 1 x  daily - 7 x weekly - 2 sets - 10 reps - 3-5s hold - Hooklying Lumbar Rotation  - 1 x daily - 7 x weekly - 2 sets - 10 reps - 3-5s hold - Child's Pose Stretch  - 2 x daily - 7 x weekly - 3 reps - 30s hold   ASSESSMENT:  CLINICAL IMPRESSION: Patient is a 36 y.o. female who was  seen today for physical therapy evaluation and treatment for back pain. Objective impairments include decreased strength and pain. These impairments are limiting patient from occupation and caring for children, and dressing . Personal factors including Past/current experiences, Time since onset of injury/illness/exacerbation, and 1 comorbidity: anxiety and depression are also affecting patient's functional outcome. Patient will benefit from skilled PT to address above impairments and improve overall function.  REHAB POTENTIAL: Excellent  CLINICAL DECISION MAKING: Stable/uncomplicated  EVALUATION COMPLEXITY: Low   GOALS: Goals reviewed with patient? No  SHORT TERM GOALS: Target date: 06/12/2022  Pt will be independent with HEP to improve strength and decrease back pain to improve pain-free function at home and work. Baseline:  Goal status: INITIAL   LONG TERM GOALS: Target date: 07/10/2022  Pt will increase FOTO to at least 69 to demonstrate significant improvement in function at home and work related to back pain  Baseline: 05/15/22: 63 Goal status: INITIAL  2.  Pt will decrease worst back pain by at least 2 points on the NPRS in order to demonstrate clinically significant reduction in back pain. Baseline: 05/15/22: worst 8/10 Goal status: INITIAL  3.  Pt will decrease mODI score by at least 13 points in order demonstrate clinically significant reduction in back pain/disability.       Baseline: 05/15/22: 28% Goal status: INITIAL    PLAN: PT FREQUENCY: 2x/week  PT DURATION: 8 weeks  PLANNED INTERVENTIONS: Therapeutic exercises, Therapeutic activity, Neuromuscular re-education, Balance training, Gait training, Patient/Family education, Joint manipulation, Joint mobilization, Canalith repositioning, Aquatic Therapy, Dry Needling, Cognitive remediation, Electrical stimulation, Spinal manipulation, Spinal mobilization, Cryotherapy, Moist heat, Traction, Ultrasound, Ionotophoresis '4mg'$ /ml  Dexamethasone, and Manual therapy  PLAN FOR NEXT SESSION: Review HEP and modify as appropriate, assess hip flexor length, pelvic floor strength/coordination assessment, eventually assess and teach functional tasks such as deep squats and lifting mechanics. Consider soft tissue work and other modalities to control pain as needed to allow for strengthening;   Phillips Grout PT, DPT, GCS  Kristoffer Bala 05/15/2022, 3:51 PM

## 2022-05-15 ENCOUNTER — Ambulatory Visit: Payer: Medicaid Other | Attending: Family

## 2022-05-15 DIAGNOSIS — M5459 Other low back pain: Secondary | ICD-10-CM | POA: Insufficient documentation

## 2022-05-16 DIAGNOSIS — Z20818 Contact with and (suspected) exposure to other bacterial communicable diseases: Secondary | ICD-10-CM | POA: Diagnosis not present

## 2022-05-16 DIAGNOSIS — J029 Acute pharyngitis, unspecified: Secondary | ICD-10-CM | POA: Diagnosis not present

## 2022-05-26 ENCOUNTER — Ambulatory Visit: Payer: Medicaid Other | Admitting: Physical Therapy

## 2022-05-26 DIAGNOSIS — K200 Eosinophilic esophagitis: Secondary | ICD-10-CM | POA: Diagnosis not present

## 2022-05-26 DIAGNOSIS — K824 Cholesterolosis of gallbladder: Secondary | ICD-10-CM | POA: Diagnosis not present

## 2022-06-02 ENCOUNTER — Ambulatory Visit: Payer: Medicaid Other | Admitting: Physical Therapy

## 2022-06-09 ENCOUNTER — Encounter: Payer: Medicaid Other | Admitting: Physical Therapy

## 2022-06-16 ENCOUNTER — Ambulatory Visit: Payer: Medicaid Other | Attending: Family | Admitting: Physical Therapy

## 2022-06-16 ENCOUNTER — Encounter: Payer: Self-pay | Admitting: Physical Therapy

## 2022-06-16 DIAGNOSIS — M5459 Other low back pain: Secondary | ICD-10-CM | POA: Insufficient documentation

## 2022-06-16 NOTE — Therapy (Signed)
OUTPATIENT PHYSICAL THERAPY THORACOLUMBAR TREATMENT   Patient Name: Carol [name] MRN: [id number] DOB:1986/09/13, 36 y.o., female Today's Date: 06/16/2022   PT End of Session - 06/16/22 1119     Visit Number 2    Number of Visits 17    Date for PT Re-Evaluation 07/10/22    Authorization Type eval: 05/15/22    PT Start Time 1115    PT Stop Time 1155    PT Time Calculation (min) 40 min    Activity Tolerance Patient tolerated treatment well    Behavior During Therapy [facility] for tasks assessed/performed             Past Medical History:  Diagnosis Date   Anxiety    Depression    doing ok   LGA (large for gestational age) fetus affecting management of mother 11/01/2019   @ 36 wks (10/24/19) -early a1c neg   Migraines    Retained placenta 03/08/2020   S/P D&C (status post dilation and curettage) 03/07/2020   Severe preeclampsia in postpartum period 08/23/2019   Approximately 1 week after first baby delivered. Severe. Started on hctz and procardia    SVD (spontaneous vaginal delivery)    x 3 - last del 02/26/20   Vaginal Pap smear, abnormal    cryo therapy, ok since   Past Surgical History:  Procedure Laterality Date   CRYOTHERAPY  2012   DILATION AND EVACUATION N/A 03/07/2020   Procedure: DILATATION AND EVACUATIONf with ULTRASOUND GUIDANCE;  Surgeon: Cherre Blanc, MD;  Location: [place];  Service: Gynecology;  Laterality: N/A;   Patient Active Problem List   Diagnosis Date Noted   GAD (generalized anxiety disorder) 09/26/2019   Allergic rhinosinusitis 09/26/2019   Abnormal gallbladder ultrasound 04/26/2018   Heart burn 02/03/2018   Migraines    Chronic headache disorder 04/22/2017   Cervicocranial syndrome 04/22/2017   Osteoarthritis 04/22/2017    PCP: Pleas Koch, NP  REFERRING PROVIDER: Pleas Koch, NP  REFERRING DIAGNOSIS: M54.9 (ICD-10-CM) - Back pain, unspecified back location, unspecified back pain laterality, unspecified  chronicity  THERAPY DIAG: Other low back pain  RATIONALE FOR EVALUATION AND TREATMENT: Rehabilitation  ONSET DATE: 03/08/22 (approximate)  FOLLOW UP APPT WITH PROVIDER: No    SUBJECTIVE:                                                                                                                                                                                         Chief Complaint: Patient presents to clinic for consultation with PFPT due to comorbid SUI with LBP. Patient states that she cannot hold her bladder for as long as she used  to. Patient also notes UI without urge with coughing/sneezing/impact activities. Patient denies UI with prolonged activity.   OBSTETRICAL HISTORY: G4P3 Deliveries: SVD Tearing/Episiotomy: grade 1 tear with all deliveries  GYNECOLOGICAL HISTORY: Hysterectomy: No Endometriosis: Negative Last Menstrual Period: 06/05/2022 Pain with exam: No  Prolapse: None Heaviness/pressure: Yes (with back pain and can feel like menstrual cramps)  UROLOGICAL HISTORY: Frequency of urination: every 1-2 hours in the morning, every 4 hours later in the day Incontinence: Coughing, Sneezing, Laughing, and Exercise  Onset: 2015 Amount: Mod-Complete Loss.  Protective undergarments: No   Fluid Intake: at lest 60 oz H20, ~36 oz coffee caffeinated, rare juices/sodas Nocturia: 0-2x/night; if not up at all, has bladder pain in the morning Toileting posture: feet flat Incomplete emptying: Yes (when anxious about bathroom access) Pain with urination: Negative Stream: Strong Urgency: Yes  Difficulty initiating urination: Negative Intermittent stream: Positive Frequent UTI: Negative.   GASTROINTESTINAL HISTORY: Type of bowel movement: (Bristol Stool Scale) 4; 1-2; 7 w/ Miralax Frequency of BMs: every 2 days prior to starting Miralax; with Miralax 1x/day; prior to pregnancy 1x/day without laxative/supplementation Incomplete bowel movement: No  Pain with defecation:  Negative Straining with defecation: Positive Hemorrhoids: Negative Toileting posture: feet flat Fiber supplement: Yes (Miralax) Incontinence: Negative.   SEXUAL HISTORY AND FUNCTION: Denies any concerns.    Pertinent History Pt reports intermittent chronic back pain since having her kids. She had bad L sided sciatica during her pregnancies however these symptoms resolved after delivery. However most recently at the beginning of April her son fell and stopped walking for a week. She had to carry him everywhere which resulted in onset of most recent episode of back pain. Pain has remained unchanged since that time. Pain is a constant dull ache but she gets intermittent stabbing pain with lifting or when flexing her hips when putting her legs in her pants. Pain is midline and radiates down slightly toward her tailbone. She has utilized icy/hot patches, OTC analgesics, and muscle relaxers with minimal benefit. She states that her husband will occasionally use their percussion massager on her back which is uncomfortable in the moment but helps afterward.  Pt states that she had an abdominoplasty to correct a significant diastasis after her last child was born. Pt denies any abnormal vaginal bleeding or pain with intercourse. She endorses stress incontinence since pregnancy.      Pain:  Pain Intensity: Present: 5/10, Best: 0/10, Worst: 8/10 Pain location: midline low back Pain Quality: dull with intermittent sharp episodes Radiating: Yes occasionally radiates down toward her sacrum but never into LEs; Numbness/Tingling: No Focal Weakness: No Aggravating factors: lifting kids, flexing hip to put on pants, sitting for an extended period (at work), pressing on her back; Relieving factors: icy/hot patches, muscle relaxers, Tylenol, heating pad, percussion massage (painful during but relieves pain afterward) 24-hour pain behavior: worse in the AM and then at night again How long can you sit: unlimited  but painful How long can you stand: no limitation History of prior back injury, pain, surgery, or therapy: No Follow-up appointment with MD: No, nothing scheduled, advised to follow-up as needed Dominant hand: left Imaging: No  Prior level of function: Independent Occupational demands: Works as a Education officer, [employer], 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 for extended period of time behind a desk; Hobbies: walking in the neighborhood, going to the pool, watching movies, time with family Red flags (bowel/bladder changes, saddle paresthesia, personal history of cancer, h/o spinal tumors, h/o compression  fx, h/o abdominal aneurysm, abdominal pain, chills/fever, night sweats, nausea, vomiting, unrelenting pain): Negative  Precautions: None  Weight Bearing Restrictions: No  Living Environment Lives with: lives with their family Lives in: House/apartment  Patient Goals: Pt would like to be able to put on her pants without having to support herself, pick up her 36 year old without pain;   TREATMENT  Pre-treatment assessment: EXTERNAL PELVIC EXAM:  Patient educated on the purpose of the procedure/exam and articulated understanding and consented to the procedure/exam.  Breath coordination: inconsistent and minimal Voluntary Contraction: present (~1/5 MMT with compensations from abdominals and adductors) Relaxation: delayed Perineal movement with sustained IAP increase ("bear down"): descent (delayed) Perineal movement with rapid IAP increase ("cough"): descent  ABDOMINAL:   Palpation: no TTP Diastasis: none noted on testing Rib flare: none noted  Manual Therapy:   Neuromuscular Re-education: Supine hooklying diaphragmatic breathing with VCs and TCs for downregulation of the nervous system and improved management of IAP Supine hooklying, PFM lengthening with inhalation. VCs and TCs to decrease compensatory patterns and encourage optimal relaxation of the  PFM. Patient education on IAP system and log roll technique for improved pain modulation and decreased load on PFM as well as abdominal wall.   Therapeutic Exercise:   Treatments unbilled:  Post-treatment assessment:  Patient educated throughout session on appropriate technique and form using multi-modal cueing, HEP, and activity modification. Patient articulated understanding and returned demonstration.  Patient Response to interventions: Comfortable to f/u with evaluating provider to focus on LBP prior to initiating PFPT interventions.   OBJECTIVE:   Patient Surveys  Modified Oswestry 28%  FOTO 63, predicted improvement to 38  HOME EXERCISE PROGRAM: Access Code: [email or web address] URL: [email or web address] Date: 05/15/2022 Prepared by: Roxana Hires  Exercises - Supine Pelvic Tilt  - 1 x daily - 7 x weekly - 2 sets - 10 reps - 3-5s hold - Hooklying Lumbar Rotation  - 1 x daily - 7 x weekly - 2 sets - 10 reps - 3-5s hold - Child's Pose Stretch  - 2 x daily - 7 x weekly - 3 reps - 30s hold   ASSESSMENT:  CLINICAL IMPRESSION: Patient presents to clinic with excellent motivation to participate in therapy. Patient demonstrates deficits in PFM coordination, PFM strength, and IAP management. Patient able to achieve basic understanding of IAP system as well as importance of diaphragmatic breathing during today's session and responded positively to educational interventions. Patient will benefit from continued skilled therapeutic intervention to address remaining deficits in lumbar spine, posture, pain, and PFM coordination and strength in order to increase function and improve overall QOL.    REHAB POTENTIAL: Excellent  CLINICAL DECISION MAKING: Stable/uncomplicated  EVALUATION COMPLEXITY: Low   GOALS: Goals reviewed with patient? No  SHORT TERM GOALS: Target date: 06/12/2022  Pt will be independent with HEP to improve strength and decrease back pain to improve  pain-free function at home and work. Baseline:  Goal status: INITIAL   LONG TERM GOALS: Target date: 07/10/2022  Pt will increase FOTO to at least 69 to demonstrate significant improvement in function at home and work related to back pain  Baseline: 05/15/22: 63 Goal status: INITIAL  2.  Pt will decrease worst back pain by at least 2 points on the NPRS in order to demonstrate clinically significant reduction in back pain. Baseline: 05/15/22: worst 8/10 Goal status: INITIAL  3.  Pt will decrease mODI score by at least 13 points in order demonstrate clinically significant reduction in back pain/disability.  Baseline: 05/15/22: 28% Goal status: INITIAL    PLAN: PT FREQUENCY: 2x/week  PT DURATION: 8 weeks  PLANNED INTERVENTIONS: Therapeutic exercises, Therapeutic activity, Neuromuscular re-education, Balance training, Gait training, Patient/Family education, Joint manipulation, Joint mobilization, Canalith repositioning, Aquatic Therapy, Dry Needling, Cognitive remediation, Electrical stimulation, Spinal manipulation, Spinal mobilization, Cryotherapy, Moist heat, Traction, Ultrasound, Ionotophoresis '4mg'$ /ml Dexamethasone, and Manual therapy  PLAN FOR NEXT SESSION: Review HEP and modify as appropriate, assess hip flexor length, eventually assess and teach functional tasks such as deep squats and lifting mechanics. Consider soft tissue work and other modalities to control pain as needed to allow for strengthening;    Myles Gip PT, DPT [phone]  06/16/2022, 11:20 AM

## 2022-06-23 ENCOUNTER — Ambulatory Visit: Payer: Medicaid Other

## 2022-06-23 DIAGNOSIS — M5459 Other low back pain: Secondary | ICD-10-CM

## 2022-07-03 DIAGNOSIS — K824 Cholesterolosis of gallbladder: Secondary | ICD-10-CM | POA: Diagnosis not present

## 2022-07-07 ENCOUNTER — Ambulatory Visit: Payer: Medicaid Other

## 2022-07-07 DIAGNOSIS — M5459 Other low back pain: Secondary | ICD-10-CM

## 2022-07-21 DIAGNOSIS — J209 Acute bronchitis, unspecified: Secondary | ICD-10-CM | POA: Diagnosis not present

## 2022-07-25 DIAGNOSIS — N926 Irregular menstruation, unspecified: Secondary | ICD-10-CM | POA: Diagnosis not present

## 2022-07-25 DIAGNOSIS — R053 Chronic cough: Secondary | ICD-10-CM | POA: Diagnosis not present

## 2022-07-25 DIAGNOSIS — E663 Overweight: Secondary | ICD-10-CM | POA: Diagnosis not present

## 2022-07-31 DIAGNOSIS — G4440 Drug-induced headache, not elsewhere classified, not intractable: Secondary | ICD-10-CM | POA: Diagnosis not present

## 2022-07-31 DIAGNOSIS — G43009 Migraine without aura, not intractable, without status migrainosus: Secondary | ICD-10-CM | POA: Diagnosis not present

## 2022-08-01 DIAGNOSIS — J329 Chronic sinusitis, unspecified: Secondary | ICD-10-CM | POA: Diagnosis not present

## 2022-08-05 DIAGNOSIS — K3189 Other diseases of stomach and duodenum: Secondary | ICD-10-CM | POA: Diagnosis not present

## 2022-08-19 ENCOUNTER — Ambulatory Visit
Admission: EM | Admit: 2022-08-19 | Discharge: 2022-08-19 | Disposition: A | Discharge status: Home / Self Care | Payer: 59 | Attending: Family Medicine | Admitting: Family Medicine

## 2022-08-19 ENCOUNTER — Encounter: Payer: Self-pay | Admitting: Emergency Medicine

## 2022-08-19 ENCOUNTER — Other Ambulatory Visit: Payer: Self-pay

## 2022-08-19 DIAGNOSIS — H6981 Other specified disorders of Eustachian tube, right ear: Secondary | ICD-10-CM | POA: Diagnosis not present

## 2022-08-19 DIAGNOSIS — J019 Acute sinusitis, unspecified: Secondary | ICD-10-CM

## 2022-08-19 MED ORDER — FLUTICASONE PROPIONATE 50 MCG/ACT NA SUSP: 2.0000 | Freq: Every day | NASAL | 2 refills | Status: AC

## 2022-08-19 NOTE — Discharge Instructions (Addendum)
Stop by the pharmacy to pick up your prescriptions.  Follow up with your primary care provider as needed. Continue taking your antibiotics    A referral was placed to a specialist, it may take 2 weeks to schedule/process the referral.  If you have not heard from the specialist in 2 weeks, please follow-up with your primary care provider for a new referral.

## 2022-08-19 NOTE — ED Triage Notes (Signed)
Pt c/o cough, bilateral ear pain, headache. Started at the beginning of august but has been getting worse. She states she has taken a round of amoxicillin and is taking cefdinir now, prescribed by her PCP.
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
## 2022-08-25 DIAGNOSIS — R053 Chronic cough: Secondary | ICD-10-CM | POA: Diagnosis not present

## 2022-08-25 DIAGNOSIS — J329 Chronic sinusitis, unspecified: Secondary | ICD-10-CM | POA: Diagnosis not present

## 2022-08-29 DIAGNOSIS — B3731 Acute candidiasis of vulva and vagina: Secondary | ICD-10-CM | POA: Diagnosis not present

## 2022-10-27 DIAGNOSIS — K5909 Other constipation: Secondary | ICD-10-CM | POA: Diagnosis not present

## 2022-10-27 DIAGNOSIS — R140 Abdominal distension (gaseous): Secondary | ICD-10-CM | POA: Diagnosis not present

## 2022-10-29 DIAGNOSIS — M549 Dorsalgia, unspecified: Secondary | ICD-10-CM | POA: Diagnosis not present

## 2022-12-19 DIAGNOSIS — M259 Joint disorder, unspecified: Secondary | ICD-10-CM | POA: Diagnosis not present

## 2022-12-19 DIAGNOSIS — M5137 Other intervertebral disc degeneration, lumbosacral region: Secondary | ICD-10-CM | POA: Diagnosis not present

## 2022-12-19 DIAGNOSIS — G8929 Other chronic pain: Secondary | ICD-10-CM | POA: Diagnosis not present

## 2022-12-24 DIAGNOSIS — R198 Other specified symptoms and signs involving the digestive system and abdomen: Secondary | ICD-10-CM | POA: Diagnosis not present

## 2022-12-24 DIAGNOSIS — K5900 Constipation, unspecified: Secondary | ICD-10-CM | POA: Diagnosis not present

## 2022-12-24 DIAGNOSIS — R197 Diarrhea, unspecified: Secondary | ICD-10-CM | POA: Diagnosis not present

## 2022-12-24 DIAGNOSIS — R150 Incomplete defecation: Secondary | ICD-10-CM | POA: Diagnosis not present

## 2023-01-14 DIAGNOSIS — M533 Sacrococcygeal disorders, not elsewhere classified: Secondary | ICD-10-CM | POA: Diagnosis not present

## 2023-01-14 DIAGNOSIS — M545 Low back pain, unspecified: Secondary | ICD-10-CM | POA: Diagnosis not present

## 2023-01-14 DIAGNOSIS — M2580 Other specified joint disorders, unspecified joint: Secondary | ICD-10-CM | POA: Diagnosis not present

## 2023-01-16 DIAGNOSIS — G43009 Migraine without aura, not intractable, without status migrainosus: Secondary | ICD-10-CM | POA: Diagnosis not present

## 2023-01-29 DIAGNOSIS — L309 Dermatitis, unspecified: Secondary | ICD-10-CM | POA: Diagnosis not present

## 2023-02-04 DIAGNOSIS — Z79899 Other long term (current) drug therapy: Secondary | ICD-10-CM | POA: Diagnosis not present

## 2023-02-25 DIAGNOSIS — K219 Gastro-esophageal reflux disease without esophagitis: Secondary | ICD-10-CM | POA: Diagnosis not present

## 2023-02-25 DIAGNOSIS — I10 Essential (primary) hypertension: Secondary | ICD-10-CM | POA: Diagnosis not present

## 2023-02-25 DIAGNOSIS — Z881 Allergy status to other antibiotic agents status: Secondary | ICD-10-CM | POA: Diagnosis not present

## 2023-02-25 DIAGNOSIS — M545 Low back pain, unspecified: Secondary | ICD-10-CM | POA: Diagnosis not present

## 2023-02-25 DIAGNOSIS — Z885 Allergy status to narcotic agent status: Secondary | ICD-10-CM | POA: Diagnosis not present

## 2023-02-25 DIAGNOSIS — M533 Sacrococcygeal disorders, not elsewhere classified: Secondary | ICD-10-CM | POA: Diagnosis not present

## 2023-02-25 DIAGNOSIS — Z79899 Other long term (current) drug therapy: Secondary | ICD-10-CM | POA: Diagnosis not present

## 2023-02-25 DIAGNOSIS — G8929 Other chronic pain: Secondary | ICD-10-CM | POA: Diagnosis not present

## 2023-02-25 DIAGNOSIS — Z87891 Personal history of nicotine dependence: Secondary | ICD-10-CM | POA: Diagnosis not present

## 2023-03-11 DIAGNOSIS — J302 Other seasonal allergic rhinitis: Secondary | ICD-10-CM | POA: Diagnosis not present

## 2023-03-11 DIAGNOSIS — J0120 Acute ethmoidal sinusitis, unspecified: Secondary | ICD-10-CM | POA: Diagnosis not present

## 2023-03-17 DIAGNOSIS — Z79899 Other long term (current) drug therapy: Secondary | ICD-10-CM | POA: Diagnosis not present

## 2023-03-17 DIAGNOSIS — R109 Unspecified abdominal pain: Secondary | ICD-10-CM | POA: Diagnosis not present

## 2023-03-20 ENCOUNTER — Other Ambulatory Visit: Payer: Self-pay | Admitting: Family

## 2023-03-20 DIAGNOSIS — R102 Pelvic and perineal pain: Secondary | ICD-10-CM

## 2023-03-20 DIAGNOSIS — R1031 Right lower quadrant pain: Secondary | ICD-10-CM

## 2023-03-30 ENCOUNTER — Ambulatory Visit
Admission: RE | Admit: 2023-03-30 | Discharge: 2023-03-30 | Disposition: A | Discharge status: Home / Self Care | Payer: 59 | Source: Ambulatory Visit | Attending: Family | Admitting: Family

## 2023-03-30 DIAGNOSIS — R102 Pelvic and perineal pain: Secondary | ICD-10-CM | POA: Diagnosis not present

## 2023-03-30 DIAGNOSIS — R1031 Right lower quadrant pain: Secondary | ICD-10-CM | POA: Insufficient documentation

## 2023-04-07 DIAGNOSIS — L910 Hypertrophic scar: Secondary | ICD-10-CM | POA: Diagnosis not present

## 2023-04-07 DIAGNOSIS — L578 Other skin changes due to chronic exposure to nonionizing radiation: Secondary | ICD-10-CM | POA: Diagnosis not present

## 2023-04-07 DIAGNOSIS — D239 Other benign neoplasm of skin, unspecified: Secondary | ICD-10-CM | POA: Diagnosis not present

## 2023-04-07 DIAGNOSIS — Z86018 Personal history of other benign neoplasm: Secondary | ICD-10-CM | POA: Diagnosis not present

## 2023-04-16 DIAGNOSIS — G43009 Migraine without aura, not intractable, without status migrainosus: Secondary | ICD-10-CM | POA: Diagnosis not present

## 2023-04-30 DIAGNOSIS — M545 Low back pain, unspecified: Secondary | ICD-10-CM | POA: Diagnosis not present

## 2023-10-15 DIAGNOSIS — R519 Headache, unspecified: Secondary | ICD-10-CM | POA: Diagnosis not present

## 2023-10-15 DIAGNOSIS — G43009 Migraine without aura, not intractable, without status migrainosus: Secondary | ICD-10-CM | POA: Diagnosis not present

## 2023-11-16 DIAGNOSIS — M533 Sacrococcygeal disorders, not elsewhere classified: Secondary | ICD-10-CM | POA: Diagnosis not present

## 2023-11-16 DIAGNOSIS — G894 Chronic pain syndrome: Secondary | ICD-10-CM | POA: Diagnosis not present

## 2023-11-16 DIAGNOSIS — M47817 Spondylosis without myelopathy or radiculopathy, lumbosacral region: Secondary | ICD-10-CM | POA: Diagnosis not present

## 2023-12-29 DIAGNOSIS — G43009 Migraine without aura, not intractable, without status migrainosus: Secondary | ICD-10-CM | POA: Diagnosis not present

## 2023-12-29 DIAGNOSIS — R519 Headache, unspecified: Secondary | ICD-10-CM | POA: Diagnosis not present

## 2023-12-29 DIAGNOSIS — G4440 Drug-induced headache, not elsewhere classified, not intractable: Secondary | ICD-10-CM | POA: Diagnosis not present

## 2024-02-02 ENCOUNTER — Encounter: Payer: Self-pay | Admitting: Student

## 2024-02-02 ENCOUNTER — Other Ambulatory Visit: Payer: Self-pay | Admitting: Student

## 2024-02-02 DIAGNOSIS — R519 Headache, unspecified: Secondary | ICD-10-CM

## 2024-02-03 ENCOUNTER — Encounter: Payer: Self-pay | Admitting: Student

## 2024-02-03 DIAGNOSIS — G43801 Other migraine, not intractable, with status migrainosus: Secondary | ICD-10-CM | POA: Diagnosis not present

## 2024-02-04 ENCOUNTER — Encounter: Payer: Self-pay | Admitting: Neurology

## 2024-02-08 ENCOUNTER — Ambulatory Visit
Admission: RE | Admit: 2024-02-08 | Discharge: 2024-02-08 | Disposition: A | Discharge status: Home / Self Care | Payer: 59 | Source: Ambulatory Visit | Attending: Student | Admitting: Student

## 2024-02-08 DIAGNOSIS — R519 Headache, unspecified: Secondary | ICD-10-CM | POA: Diagnosis not present

## 2024-02-09 DIAGNOSIS — M533 Sacrococcygeal disorders, not elsewhere classified: Secondary | ICD-10-CM | POA: Diagnosis not present

## 2024-02-09 DIAGNOSIS — G894 Chronic pain syndrome: Secondary | ICD-10-CM | POA: Diagnosis not present

## 2024-02-09 DIAGNOSIS — M47817 Spondylosis without myelopathy or radiculopathy, lumbosacral region: Secondary | ICD-10-CM | POA: Diagnosis not present

## 2024-02-22 DIAGNOSIS — Z13220 Encounter for screening for lipoid disorders: Secondary | ICD-10-CM | POA: Diagnosis not present

## 2024-02-22 DIAGNOSIS — Z0000 Encounter for general adult medical examination without abnormal findings: Secondary | ICD-10-CM | POA: Diagnosis not present

## 2024-02-22 DIAGNOSIS — Z79899 Other long term (current) drug therapy: Secondary | ICD-10-CM | POA: Diagnosis not present

## 2024-02-22 DIAGNOSIS — Z131 Encounter for screening for diabetes mellitus: Secondary | ICD-10-CM | POA: Diagnosis not present

## 2024-03-01 DIAGNOSIS — R519 Headache, unspecified: Secondary | ICD-10-CM | POA: Diagnosis not present

## 2024-03-01 DIAGNOSIS — G43009 Migraine without aura, not intractable, without status migrainosus: Secondary | ICD-10-CM | POA: Diagnosis not present

## 2024-03-28 DIAGNOSIS — M222X2 Patellofemoral disorders, left knee: Secondary | ICD-10-CM | POA: Diagnosis not present

## 2024-03-28 DIAGNOSIS — M222X1 Patellofemoral disorders, right knee: Secondary | ICD-10-CM | POA: Diagnosis not present

## 2024-05-27 ENCOUNTER — Telehealth: Payer: Self-pay

## 2024-05-27 DIAGNOSIS — G43011 Migraine without aura, intractable, with status migrainosus: Secondary | ICD-10-CM

## 2024-05-27 NOTE — Progress Notes (Signed)
 Thank you for this referral. The [facility] team receives referrals for eligible patients: all patients with [facility] Primary Care Provider (any health plan including Medicaid or uninsured) and patients with a [facility] Primary Care Provider AND contracted health plan.     This patient does not have a [facility] or [facility] Primary Care Provider. [facility] cannot directly provide Care Management services to patients who do not have a qualifying Primary Care Provider. Please call us if you need further clarification at [phone].     [facility]  [facility] Health Care Management Assistant Direct Dial: [phone]  Fax: [phone]

## 2024-05-27 NOTE — Progress Notes (Signed)
 Complex Care Management Note Care Guide Note  05/27/2024 Name: Carol [name] MRN: [id number] DOB: 04-03-1986   Complex Care Management Outreach Attempts: An unsuccessful telephone outreach was attempted today to offer the patient information about available complex care management services.  Follow Up Plan:  Additional outreach attempts will be made to offer the patient complex care management information and services.   Encounter Outcome:  No Answer  [facility]  [facility] Health Care Management Assistant Direct Dial: [phone]  Fax: [phone]

## 2024-07-18 ENCOUNTER — Other Ambulatory Visit: Payer: Self-pay | Admitting: Medical Genetics

## 2024-09-23 ENCOUNTER — Other Ambulatory Visit: Payer: Self-pay | Admitting: Medical Genetics

## 2024-09-23 DIAGNOSIS — Z006 Encounter for examination for normal comparison and control in clinical research program: Secondary | ICD-10-CM

## 2024-10-10 DIAGNOSIS — J22 Unspecified acute lower respiratory infection: Secondary | ICD-10-CM | POA: Diagnosis not present

## 2024-11-08 DIAGNOSIS — G4440 Drug-induced headache, not elsewhere classified, not intractable: Secondary | ICD-10-CM | POA: Diagnosis not present

## 2024-11-08 DIAGNOSIS — G43009 Migraine without aura, not intractable, without status migrainosus: Secondary | ICD-10-CM | POA: Diagnosis not present

## 2024-11-08 DIAGNOSIS — R519 Headache, unspecified: Secondary | ICD-10-CM | POA: Diagnosis not present

## 2024-11-25 DIAGNOSIS — R221 Localized swelling, mass and lump, neck: Secondary | ICD-10-CM | POA: Diagnosis not present
